# Patient Record
Sex: Female | Born: 1965 | Race: Black or African American | Hispanic: No | Marital: Single | Smoking: Never smoker
Health system: Southern US, Community
[De-identification: ages and names within clinical notes are randomized; demographics above are authoritative.]

## PROBLEM LIST (undated history)

## (undated) DIAGNOSIS — G43909 Migraine, unspecified, not intractable, without status migrainosus: Secondary | ICD-10-CM

## (undated) DIAGNOSIS — M5136 Other intervertebral disc degeneration, lumbar region: Secondary | ICD-10-CM

## (undated) DIAGNOSIS — M51369 Other intervertebral disc degeneration, lumbar region without mention of lumbar back pain or lower extremity pain: Secondary | ICD-10-CM

## (undated) HISTORY — DX: Other intervertebral disc degeneration, lumbar region: M51.36

## (undated) HISTORY — DX: Migraine, unspecified, not intractable, without status migrainosus: G43.909

## (undated) HISTORY — DX: Other intervertebral disc degeneration, lumbar region without mention of lumbar back pain or lower extremity pain: M51.369

## (undated) HISTORY — PX: COLONOSCOPY: SHX174

## (undated) HISTORY — PX: BUNIONECTOMY: SHX129

---

## 2008-12-14 DIAGNOSIS — Z90710 Acquired absence of both cervix and uterus: Secondary | ICD-10-CM

## 2008-12-14 HISTORY — PX: BACK SURGERY: SHX140

## 2008-12-14 HISTORY — DX: Acquired absence of both cervix and uterus: Z90.710

## 2022-05-18 ENCOUNTER — Other Ambulatory Visit: Payer: Self-pay | Admitting: Family Medicine

## 2022-05-18 DIAGNOSIS — Z1231 Encounter for screening mammogram for malignant neoplasm of breast: Secondary | ICD-10-CM

## 2022-05-19 ENCOUNTER — Ambulatory Visit
Admission: RE | Admit: 2022-05-19 | Discharge: 2022-05-19 | Disposition: A | Discharge status: Home / Self Care | Payer: BLUE CROSS/BLUE SHIELD | Source: Ambulatory Visit | Attending: Family Medicine | Admitting: Family Medicine

## 2022-05-19 DIAGNOSIS — Z1231 Encounter for screening mammogram for malignant neoplasm of breast: Secondary | ICD-10-CM

## 2022-08-21 ENCOUNTER — Encounter: Payer: Self-pay | Admitting: Adult Health

## 2022-08-21 ENCOUNTER — Ambulatory Visit (INDEPENDENT_AMBULATORY_CARE_PROVIDER_SITE_OTHER): Payer: BLUE CROSS/BLUE SHIELD | Admitting: Adult Health

## 2022-08-21 VITALS — BP 128/80 | HR 82 | Temp 97.9°F | Ht 69.0 in | Wt 238.0 lb

## 2022-08-21 DIAGNOSIS — Z6835 Body mass index (BMI) 35.0-35.9, adult: Secondary | ICD-10-CM

## 2022-08-21 DIAGNOSIS — M5441 Lumbago with sciatica, right side: Secondary | ICD-10-CM

## 2022-08-21 DIAGNOSIS — M5442 Lumbago with sciatica, left side: Secondary | ICD-10-CM

## 2022-08-21 DIAGNOSIS — E1169 Type 2 diabetes mellitus with other specified complication: Secondary | ICD-10-CM | POA: Diagnosis not present

## 2022-08-21 DIAGNOSIS — G43109 Migraine with aura, not intractable, without status migrainosus: Secondary | ICD-10-CM

## 2022-08-21 DIAGNOSIS — E119 Type 2 diabetes mellitus without complications: Secondary | ICD-10-CM | POA: Insufficient documentation

## 2022-08-21 DIAGNOSIS — M545 Low back pain, unspecified: Secondary | ICD-10-CM | POA: Insufficient documentation

## 2022-08-21 DIAGNOSIS — G8929 Other chronic pain: Secondary | ICD-10-CM

## 2022-08-21 DIAGNOSIS — E669 Obesity, unspecified: Secondary | ICD-10-CM | POA: Diagnosis not present

## 2022-08-21 MED ORDER — GABAPENTIN 300 MG PO CAPS: 300.0000 mg | ORAL_CAPSULE | Freq: Three times a day (TID) | ORAL | 1 refills | Status: DC

## 2022-08-21 MED ORDER — UBRELVY 100 MG PO TABS: 1.0000 | ORAL_TABLET | ORAL | 0 refills | Status: DC | PRN

## 2022-08-21 NOTE — Progress Notes (Signed)
Location:  [facility]  Provider: Durenda Age DNP  Code Status:   Full Code  Goals of Care:     08/21/2022    8:26 AM  Advanced Directives  Does Patient Have a Medical Advance Directive? No  Does patient want to make changes to medical advance directive? Yes (MAU/Ambulatory/Procedural Areas - Information given)     Chief Complaint  Patient presents with   Establish Care    New patient to establish care. Patient does not have pill bottles present at initial appointment.     HPI: Patient is a 56 y.o. female seen today to establish care with [facility]. She has a PMH of hypertension. She recently moved to [place], [place] from [place]. She lives by herself. She takes Gabapentin for her chronic low back pain. She takes [place] for migraine. She has 2-10 migraine episodes in a month. She has diabetes mellitus and is not on  any medication. Latest hgbA1c 6.5.   Her mother suffered from hypertension and died at 26 years of age. Father's history is unknown since her father was killed when she was young. She has a sister who died of rectal cancer at the age of 52. She had colonoscopy in 2018. She had total hysterectomy in 2012 due to an ovarian rupture. She has a brother who is blind and another brother who died of hypertension and diabetes mellitus. She has a Conservator, museum/gallery who is healthy.   Past Medical History:  Diagnosis Date   Bulging lumbar disc    H/O: hysterectomy 2010   Complete   Migraines     Past Surgical History:  Procedure Laterality Date   BACK SURGERY  2010   BUNIONECTOMY      Allergies  Allergen Reactions   Grass Pollen(K-O-R-T-Swt Vern) Other (See Comments)   Codeine Nausea And Vomiting    Outpatient Encounter Medications as of 08/21/2022  Medication Sig   COMBIGAN 0.2-0.5 % ophthalmic solution Apply 1 drop to eye 2 (two) times daily.   gabapentin (NEURONTIN) 300 MG capsule Take 300 mg by mouth 3 (three) times daily.   Multiple Vitamin  (MULTIVITAMIN) tablet Take 1 tablet by mouth daily.   Ubrogepant (UBRELVY) 100 MG TABS Take 1 tablet by mouth as needed (For Migrainges).   No facility-administered encounter medications on file as of 08/21/2022.    Review of Systems:  Review of Systems  Constitutional:  Negative for appetite change, chills, fatigue and fever.  HENT:  Negative for congestion, hearing loss, rhinorrhea and sore throat.   Eyes: Negative.   Respiratory:  Negative for cough, shortness of breath and wheezing.   Cardiovascular:  Negative for chest pain, palpitations and leg swelling.  Gastrointestinal:  Negative for abdominal pain, constipation, diarrhea, nausea and vomiting.  Genitourinary:  Negative for dysuria.  Musculoskeletal:  Negative for arthralgias, back pain and myalgias.  Skin:  Negative for color change, rash and wound.  Neurological:  Negative for dizziness, weakness and headaches.  Psychiatric/Behavioral:  Negative for behavioral problems. The patient is not nervous/anxious.     Health Maintenance  Topic Date Due   HIV Screening  Never done   Hepatitis C Screening  Never done   TETANUS/TDAP  Never done   PAP SMEAR-Modifier  Never done   COLONOSCOPY (Pts 45-74yr Insurance coverage will need to be confirmed)  Never done   Zoster Vaccines- Shingrix (1 of 2) Never done   COVID-19 Vaccine (4 - Pfizer series) 03/27/2021   INFLUENZA VACCINE  Never done  MAMMOGRAM  05/19/2024   HPV VACCINES  Aged Out    Physical Exam: Vitals:   08/21/22 0818  BP: 128/80  Pulse: 82  Temp: 97.9 F (36.6 C)  TempSrc: Temporal  SpO2: 98%  Weight: 238 lb (108 kg)  Height: '5\' 9"'$  (1.753 m)   Body mass index is 35.15 kg/m. Physical Exam Constitutional:      Appearance: She is obese.  HENT:     Head: Normocephalic and atraumatic.     Nose: Nose normal.     Mouth/Throat:     Mouth: Mucous membranes are moist.  Eyes:     Conjunctiva/sclera: Conjunctivae normal.  Cardiovascular:     Rate and Rhythm:  Normal rate and regular rhythm.  Pulmonary:     Effort: Pulmonary effort is normal.     Breath sounds: Normal breath sounds.  Abdominal:     General: Bowel sounds are normal.     Palpations: Abdomen is soft.  Musculoskeletal:        General: Normal range of motion.     Cervical back: Normal range of motion.  Skin:    General: Skin is warm and dry.  Neurological:     General: No focal deficit present.     Mental Status: She is alert and oriented to person, place, and time.  Psychiatric:        Mood and Affect: Mood normal.        Behavior: Behavior normal.        Thought Content: Thought content normal.        Judgment: Judgment normal.     Labs reviewed: Basic Metabolic Panel: No results for input(s): "NA", "K", "CL", "CO2", "GLUCOSE", "BUN", "CREATININE", "CALCIUM", "MG", "PHOS", "TSH" in the last 8760 hours. Liver Function Tests: No results for input(s): "AST", "ALT", "ALKPHOS", "BILITOT", "PROT", "ALBUMIN" in the last 8760 hours. No results for input(s): "LIPASE", "AMYLASE" in the last 8760 hours. No results for input(s): "AMMONIA" in the last 8760 hours. CBC: No results for input(s): "WBC", "NEUTROABS", "HGB", "HCT", "MCV", "PLT" in the last 8760 hours. Lipid Panel: No results for input(s): "CHOL", "HDL", "LDLCALC", "TRIG", "CHOLHDL", "LDLDIRECT" in the last 8760 hours. No results found for: "HGBA1C"  Procedures since last visit: No results found.  Assessment/Plan . 1. Chronic bilateral low back pain with bilateral sciatica -  stable - gabapentin (NEURONTIN) 300 MG capsule; Take 1 capsule (300 mg total) by mouth 3 (three) times daily.  Dispense: 180 capsule; Refill: 1  2. Migraine with aura and without status migrainosus, not intractable - 2-10 episodes in a month - Ubrogepant (UBRELVY) 100 MG TABS; Take 1 tablet by mouth as needed (For Migrainges).  Dispense: 10 tablet; Refill: 0  3. Type 2 diabetes mellitus with other specified complication, without long-term  current use of insulin ([place]) -  last hgbA1c 6.5, 03/28/21 -  diet-controlled  4. Class 2 obesity with body mass index (BMI) of 35.0 to 35.9 in adult, unspecified obesity type, unspecified whether serious comorbidity present Body mass index is 35.15 kg/m. -  discussed weight loss and dietary choices -  increase vegetable intake    Labs/tests ordered:   Will need annual labs done next visit.  Next appt:  in 3 months

## 2022-08-21 NOTE — Patient Instructions (Signed)
Preventive Care 40-58 Years Old, Female Preventive care refers to lifestyle choices and visits with your health care provider that can promote health and wellness. Preventive care visits are also called wellness exams. What can I expect for my preventive care visit? Counseling Your health care provider may ask you questions about your: Medical history, including: Past medical problems. Family medical history. Pregnancy history. Current health, including: Menstrual cycle. Method of birth control. Emotional well-being. Home life and relationship well-being. Sexual activity and sexual health. Lifestyle, including: Alcohol, nicotine or tobacco, and drug use. Access to firearms. Diet, exercise, and sleep habits. Work and work environment. Sunscreen use. Safety issues such as seatbelt and bike helmet use. Physical exam Your health care provider will check your: Height and weight. These may be used to calculate your BMI (body mass index). BMI is a measurement that tells if you are at a healthy weight. Waist circumference. This measures the distance around your waistline. This measurement also tells if you are at a healthy weight and may help predict your risk of certain diseases, such as type 2 diabetes and high blood pressure. Heart rate and blood pressure. Body temperature. Skin for abnormal spots. What immunizations do I need?  Vaccines are usually given at various ages, according to a schedule. Your health care provider will recommend vaccines for you based on your age, medical history, and lifestyle or other factors, such as travel or where you work. What tests do I need? Screening Your health care provider may recommend screening tests for certain conditions. This may include: Lipid and cholesterol levels. Diabetes screening. This is done by checking your blood sugar (glucose) after you have not eaten for a while (fasting). Pelvic exam and Pap test. Hepatitis B test. Hepatitis C  test. HIV (human immunodeficiency virus) test. STI (sexually transmitted infection) testing, if you are at risk. Lung cancer screening. Colorectal cancer screening. Mammogram. Talk with your health care provider about when you should start having regular mammograms. This may depend on whether you have a family history of breast cancer. BRCA-related cancer screening. This may be done if you have a family history of breast, ovarian, tubal, or peritoneal cancers. Bone density scan. This is done to screen for osteoporosis. Talk with your health care provider about your test results, treatment options, and if necessary, the need for more tests. Follow these instructions at home: Eating and drinking  Eat a diet that includes fresh fruits and vegetables, whole grains, lean protein, and low-fat dairy products. Take vitamin and mineral supplements as recommended by your health care provider. Do not drink alcohol if: Your health care provider tells you not to drink. You are pregnant, may be pregnant, or are planning to become pregnant. If you drink alcohol: Limit how much you have to 0-1 drink a day. Know how much alcohol is in your drink. In the [place] one drink equals one 12 oz bottle of beer (355 mL), one 5 oz glass of wine (148 mL), or one 1 oz glass of hard liquor (44 mL). Lifestyle Brush your teeth every morning and night with fluoride toothpaste. Floss one time each day. Exercise for at least 30 minutes 5 or more days each week. Do not use any products that contain nicotine or tobacco. These products include cigarettes, chewing tobacco, and vaping devices, such as e-cigarettes. If you need help quitting, ask your health care provider. Do not use drugs. If you are sexually active, practice safe sex. Use a condom or other form of protection to   prevent STIs. If you do not wish to become pregnant, use a form of birth control. If you plan to become pregnant, see your health care provider for a  prepregnancy visit. Take aspirin only as told by your health care provider. Make sure that you understand how much to take and what form to take. Work with your health care provider to find out whether it is safe and beneficial for you to take aspirin daily. Find healthy ways to manage stress, such as: Meditation, yoga, or listening to music. Journaling. Talking to a trusted person. Spending time with friends and family. Minimize exposure to UV radiation to reduce your risk of skin cancer. Safety Always wear your seat belt while driving or riding in a vehicle. Do not drive: If you have been drinking alcohol. Do not ride with someone who has been drinking. When you are tired or distracted. While texting. If you have been using any mind-altering substances or drugs. Wear a helmet and other protective equipment during sports activities. If you have firearms in your house, make sure you follow all gun safety procedures. Seek help if you have been physically or sexually abused. What's next? Visit your health care provider once a year for an annual wellness visit. Ask your health care provider how often you should have your eyes and teeth checked. Stay up to date on all vaccines. This information is not intended to replace advice given to you by your health care provider. Make sure you discuss any questions you have with your health care provider. Document Revised: 05/28/2021 Document Reviewed: 05/28/2021 Elsevier Patient Education  [place].

## 2022-08-27 ENCOUNTER — Telehealth: Payer: Self-pay | Admitting: *Deleted

## 2022-08-27 NOTE — Telephone Encounter (Signed)
[employer] faxed from for a Prior Authorization for Ubrelvy '100mg'$   Placed in Destiny Fuentes's folder to review and sign.   To be faxed back to fax:[phone] once completed.

## 2022-08-28 ENCOUNTER — Encounter: Payer: Self-pay | Admitting: Adult Health

## 2022-08-28 NOTE — Telephone Encounter (Signed)
Authorization signed and faxed.

## 2022-09-02 ENCOUNTER — Other Ambulatory Visit: Payer: Self-pay | Admitting: Adult Health

## 2022-09-02 DIAGNOSIS — Z800 Family history of malignant neoplasm of digestive organs: Secondary | ICD-10-CM

## 2022-09-02 NOTE — Telephone Encounter (Signed)
Good Morning Destiny Fuentes,  Patient is calling because a message was sent to you on 08/28/22 and she has yet to hear back.   Patient states you told her that you would review her records from previous provider and call her with next the step. Patient did not elaborate further as to what you would be determining from reviewing her previous records.  Please advise. Patient is expecting a return call today.

## 2022-09-21 ENCOUNTER — Other Ambulatory Visit: Payer: Self-pay | Admitting: Adult Health

## 2022-09-21 DIAGNOSIS — G43109 Migraine with aura, not intractable, without status migrainosus: Secondary | ICD-10-CM

## 2022-09-21 MED ORDER — UBRELVY 100 MG PO TABS: ORAL_TABLET | ORAL | 0 refills | Status: DC

## 2022-09-29 ENCOUNTER — Encounter: Payer: Self-pay | Admitting: Adult Health

## 2022-10-15 ENCOUNTER — Telehealth: Payer: Self-pay

## 2022-10-15 NOTE — Telephone Encounter (Signed)
Patient is calling the office to check on the statud of her UBRELVY 100 mg tablets. Patient states she has been waiting on a approval or denial for a couple of weeks. Patient said she has been waiting weeks to hear from our office, and feels like she has been very patient.

## 2022-10-16 ENCOUNTER — Other Ambulatory Visit: Payer: Self-pay | Admitting: Adult Health

## 2022-10-16 DIAGNOSIS — G43109 Migraine with aura, not intractable, without status migrainosus: Secondary | ICD-10-CM

## 2022-10-27 NOTE — Telephone Encounter (Signed)
Received Fax from [place] stating that Destiny Fuentes '100mg'$  is DENIED due to Coverage is provided for patient's who tried at least one triptan or have a contraindication to triptans.   Fax from [place] routed to [place] for advice.

## 2022-10-29 NOTE — Telephone Encounter (Signed)
Medina-Vargas, [name] C, NP  You1 hour ago (9:04 AM)   Noted. Patient was aware of it and will discuss on next visit.

## 2022-11-20 ENCOUNTER — Ambulatory Visit: Payer: BLUE CROSS/BLUE SHIELD | Admitting: Adult Health

## 2022-11-26 ENCOUNTER — Encounter: Payer: Self-pay | Admitting: Adult Health

## 2022-11-27 ENCOUNTER — Ambulatory Visit: Payer: Managed Care, Other (non HMO) | Admitting: Adult Health

## 2022-11-27 ENCOUNTER — Encounter: Payer: Self-pay | Admitting: Adult Health

## 2022-11-27 VITALS — BP 118/80 | HR 63 | Temp 97.3°F | Resp 18 | Ht 69.0 in | Wt 234.0 lb

## 2022-11-27 DIAGNOSIS — G43109 Migraine with aura, not intractable, without status migrainosus: Secondary | ICD-10-CM

## 2022-11-27 DIAGNOSIS — E669 Obesity, unspecified: Secondary | ICD-10-CM | POA: Diagnosis not present

## 2022-11-27 DIAGNOSIS — Z6835 Body mass index (BMI) 35.0-35.9, adult: Secondary | ICD-10-CM

## 2022-11-27 DIAGNOSIS — G8929 Other chronic pain: Secondary | ICD-10-CM

## 2022-11-27 DIAGNOSIS — E1169 Type 2 diabetes mellitus with other specified complication: Secondary | ICD-10-CM

## 2022-11-27 DIAGNOSIS — M5442 Lumbago with sciatica, left side: Secondary | ICD-10-CM

## 2022-11-27 DIAGNOSIS — E7800 Pure hypercholesterolemia, unspecified: Secondary | ICD-10-CM

## 2022-11-27 DIAGNOSIS — M5441 Lumbago with sciatica, right side: Secondary | ICD-10-CM

## 2022-11-27 NOTE — Progress Notes (Signed)
[facility]  Provider:  Durenda Age DNP  Code Status:  Full Code  Goals of Care:     11/26/2022   12:48 PM  Advanced Directives  Does Patient Have a Medical Advance Directive? No  Would patient like information on creating a medical advance directive? No - Patient declined     Chief Complaint  Patient presents with   Medical Management of Chronic Issues    Patient is here for a follow up for chronic conditions     HPI: Patient is a 56 y.o. female seen today for medical management of chronic issues.    Migraine -  she has migraine 2-3X/month, had episode last night. Prior authorization for Roselyn Meier was denied by insurance. She wants to try Rizatriptan which she used before.  Type 2 diabetes mellitus -  not on medications, eats less carbs  Hypercholesterolemia -  eat more vegetables and fruits, started going to the gym a month ago and exercise  for 3 hours X 6 days a week    Past Medical History:  Diagnosis Date   Bulging lumbar disc    H/O: hysterectomy 2010   Complete   Migraines     Past Surgical History:  Procedure Laterality Date   BACK SURGERY  2010   BUNIONECTOMY      Allergies  Allergen Reactions   Grass Pollen(K-O-R-T-Swt Vern) Other (See Comments)   Pollen Extract Other (See Comments)   Codeine Nausea And Vomiting    Outpatient Encounter Medications as of 11/27/2022  Medication Sig   COMBIGAN 0.2-0.5 % ophthalmic solution Apply 1 drop to eye 2 (two) times daily.   gabapentin (NEURONTIN) 300 MG capsule Take 1 capsule (300 mg total) by mouth 3 (three) times daily.   Multiple Vitamin (MULTIVITAMIN) tablet Take 1 tablet by mouth daily.   Ubrogepant (UBRELVY) 100 MG TABS TAKE 1 TABLET BY MOUTH AS NEEDED FOR MIGRAINES G43.109   No facility-administered encounter medications on file as of 11/27/2022.    Review of Systems:  Review of Systems  Constitutional:  Negative for appetite change, chills, fatigue and fever.  HENT:  Negative for  congestion, hearing loss, rhinorrhea and sore throat.   Eyes: Negative.   Respiratory:  Negative for cough, shortness of breath and wheezing.   Cardiovascular:  Negative for chest pain, palpitations and leg swelling.  Gastrointestinal:  Negative for abdominal pain, constipation, diarrhea, nausea and vomiting.  Genitourinary:  Negative for dysuria.  Musculoskeletal:  Negative for arthralgias, back pain and myalgias.  Skin:  Negative for color change, rash and wound.  Neurological:  Negative for dizziness, weakness and headaches.       Headache 2-3X/month  Psychiatric/Behavioral:  Negative for behavioral problems. The patient is not nervous/anxious.     Health Maintenance  Topic Date Due   HEMOGLOBIN A1C  Never done   FOOT EXAM  Never done   OPHTHALMOLOGY EXAM  Never done   HIV Screening  Never done   Diabetic kidney evaluation - eGFR measurement  Never done   Diabetic kidney evaluation - Urine ACR  Never done   Hepatitis C Screening  Never done   DTaP/Tdap/Td (1 - Tdap) Never done   PAP SMEAR-Modifier  Never done   COLONOSCOPY (Pts 45-46yr Insurance coverage will need to be confirmed)  Never done   Zoster Vaccines- Shingrix (1 of 2) Never done   INFLUENZA VACCINE  Never done   COVID-19 Vaccine (4 - 2023-24 season) 08/14/2022   MAMMOGRAM  05/19/2024   HPV  VACCINES  Aged Out    Physical Exam: Vitals:   11/27/22 0837  BP: 118/80  Pulse: 63  Resp: 18  Temp: (!) 97.3 F (36.3 C)  SpO2: 99%  Weight: 234 lb (106.1 kg)  Height: _0  (1.753 m)   Body mass index is 34.56 kg/m. Physical Exam Constitutional:      General: She is not in acute distress.    Appearance: She is obese.  HENT:     Head: Normocephalic and atraumatic.     Nose: Nose normal.     Mouth/Throat:     Mouth: Mucous membranes are moist.  Eyes:     Conjunctiva/sclera: Conjunctivae normal.  Cardiovascular:     Rate and Rhythm: Normal rate and regular rhythm.  Pulmonary:     Effort: Pulmonary effort is  normal.     Breath sounds: Normal breath sounds.  Abdominal:     General: Bowel sounds are normal.     Palpations: Abdomen is soft.  Musculoskeletal:        General: Normal range of motion.     Cervical back: Normal range of motion.  Skin:    General: Skin is warm and dry.  Neurological:     General: No focal deficit present.     Mental Status: She is alert and oriented to person, place, and time.  Psychiatric:        Mood and Affect: Mood normal.        Behavior: Behavior normal.        Thought Content: Thought content normal.        Judgment: Judgment normal.     Labs reviewed: Basic Metabolic Panel: No results for input(s): "NA", "K", "CL", "CO2", "GLUCOSE", "BUN", "CREATININE", "CALCIUM", "MG", "PHOS", "TSH" in the last 8760 hours. Liver Function Tests: No results for input(s): "AST", "ALT", "ALKPHOS", "BILITOT", "PROT", "ALBUMIN" in the last 8760 hours. No results for input(s): "LIPASE", "AMYLASE" in the last 8760 hours. No results for input(s): "AMMONIA" in the last 8760 hours. CBC: No results for input(s): "WBC", "NEUTROABS", "HGB", "HCT", "MCV", "PLT" in the last 8760 hours. Lipid Panel: No results for input(s): "CHOL", "HDL", "LDLCALC", "TRIG", "CHOLHDL", "LDLDIRECT" in the last 8760 hours. No results found for: "HGBA1C"  Procedures since last visit: No results found.  Assessment/Plan  1. Migraine with aura and without status migrainosus, not intractable -  prior authorization was for Roselyn Meier was denied by insurance -  would like to try Rizatriptan  2. Type 2 diabetes mellitus with other specified complication, without long-term current use of insulin ([facility]) -  diet-controlled - CBC With Differential/Platelet; Future - CMP with eGFR([facility]); Future - Hemoglobin A1C; Future - TSH; Future   3. Chronic bilateral low back pain with bilateral sciatica -  stable, takes Gabapentin  4. Class 2 obesity with body mass index (BMI) of 35.0 to 35.9 in adult,  unspecified obesity type, unspecified whether serious comorbidity present Wt Readings from Last 3 Encounters:  11/27/22 234 lb (106.1 kg)  08/21/22 238 lb (108 kg)  Recently joined a gym membership and exercises for 3 hours X 6 days a week   5. Hypercholesterolemia - cholesterol 238, LDL 148 -  03/28/21 - Lipid panel; Future     Labs/tests ordered:   - CBC With Differential/Platelet; Future - CMP with eGFR([facility]); Future - Hemoglobin A1C; Future - TSH; Future - Lipid panel; Future   Next appt:  in a month

## 2022-11-27 NOTE — Patient Instructions (Signed)
Migraine Headache  A migraine headache is a very strong throbbing pain on one side or both sides of your head. This type of headache can also cause other symptoms. It can last from 4 hours to 3 days. Talk with your doctor about what things may bring on (trigger) this condition.  What are the causes?  The exact cause of this condition is not known. This condition may be triggered or caused by:  Drinking alcohol.  Smoking.  Taking medicines, such as:  Medicine used to treat chest pain (nitroglycerin).  Birth control pills.  Estrogen.  Some blood pressure medicines.  Eating or drinking certain products.  Doing physical activity.  Other things that may trigger a migraine headache include:  Having a menstrual period.  Pregnancy.  Hunger.  Stress.  Not getting enough sleep or getting too much sleep.  Weather changes.  Tiredness (fatigue).  What increases the risk?  Being 25-55 years old.  Being female.  Having a family history of migraine headaches.  Being Caucasian.  Having depression or anxiety.  Being very overweight.  What are the signs or symptoms?  A throbbing pain. This pain may:  Happen in any area of the head, such as on one side or both sides.  Make it hard to do daily activities.  Get worse with physical activity.  Get worse around bright lights or loud noises.  Other symptoms may include:  Feeling sick to your stomach (nauseous).  Vomiting.  Dizziness.  Being sensitive to bright lights, loud noises, or smells.  Before you get a migraine headache, you may get warning signs (an aura). An aura may include:  Seeing flashing lights or having blind spots.  Seeing bright spots, halos, or zigzag lines.  Having tunnel vision or blurred vision.  Having numbness or a tingling feeling.  Having trouble talking.  Having weak muscles.  Some people have symptoms after a migraine headache (postdromal phase), such as:  Tiredness.  Trouble thinking (concentrating).  How is this treated?  Taking medicines that:  Relieve  pain.  Relieve the feeling of being sick to your stomach.  Prevent migraine headaches.  Treatment may also include:  Having acupuncture.  Avoiding foods that bring on migraine headaches.  Learning ways to control your body functions (biofeedback).  Therapy to help you know and deal with negative thoughts (cognitive behavioral therapy).  Follow these instructions at home:  Medicines  Take over-the-counter and prescription medicines only as told by your doctor.  Ask your doctor if the medicine prescribed to you:  Requires you to avoid driving or using heavy machinery.  Can cause trouble pooping (constipation). You may need to take these steps to prevent or treat trouble pooping:  Drink enough fluid to keep your pee (urine) pale yellow.  Take over-the-counter or prescription medicines.  Eat foods that are high in fiber. These include beans, whole grains, and fresh fruits and vegetables.  Limit foods that are high in fat and sugar. These include fried or sweet foods.  Lifestyle  Do not drink alcohol.  Do not use any products that contain nicotine or tobacco, such as cigarettes, e-cigarettes, and chewing tobacco. If you need help quitting, ask your doctor.  Get at least 8 hours of sleep every night.  Limit and deal with stress.  General instructions  Keep a journal to find out what may bring on your migraine headaches. For example, write down:  What you eat and drink.  How much sleep you get.  Any change in   what you eat or drink.  Any change in your medicines.  If you have a migraine headache:  Avoid things that make your symptoms worse, such as bright lights.  It may help to lie down in a dark, quiet room.  Do not drive or use heavy machinery.  Ask your doctor what activities are safe for you.  Keep all follow-up visits as told by your doctor. This is important.  Contact a doctor if:  You get a migraine headache that is different or worse than others you have had.  You have more than 15 headache days in one month.  Get  help right away if:  Your migraine headache gets very bad.  Your migraine headache lasts longer than 72 hours.  You have a fever.  You have a stiff neck.  You have trouble seeing.  Your muscles feel weak or like you cannot control them.  You start to lose your balance a lot.  You start to have trouble walking.  You pass out (faint).  You have a seizure.  Summary  A migraine headache is a very strong throbbing pain on one side or both sides of your head. These headaches can also cause other symptoms.  This condition may be treated with medicines and changes to your lifestyle.  Keep a journal to find out what may bring on your migraine headaches.  Contact a doctor if you get a migraine headache that is different or worse than others you have had.  Contact your doctor if you have more than 15 headache days in a month.  This information is not intended to replace advice given to you by your health care provider. Make sure you discuss any questions you have with your health care provider.  Document Revised: 05/14/2022 Document Reviewed: 01/12/2019  Elsevier Patient Education  2023 [employer]

## 2022-11-28 LAB — COMPLETE METABOLIC PANEL WITH GFR
AG Ratio: 1.6 (calc) (ref 1.0–2.5)
ALT: 15 U/L (ref 6–29)
AST: 18 U/L (ref 10–35)
Albumin: 4.3 g/dL (ref 3.6–5.1)
Alkaline phosphatase (APISO): 105 U/L (ref 37–153)
BUN: 17 mg/dL (ref 7–25)
CO2: 26 mmol/L (ref 20–32)
Calcium: 9.6 mg/dL (ref 8.6–10.4)
Chloride: 107 mmol/L (ref 98–110)
Creat: 0.85 mg/dL (ref 0.50–1.03)
Globulin: 2.7 g/dL (calc) (ref 1.9–3.7)
Glucose, Bld: 136 mg/dL — ABNORMAL HIGH (ref 65–99)
Potassium: 4.4 mmol/L (ref 3.5–5.3)
Sodium: 140 mmol/L (ref 135–146)
Total Bilirubin: 0.7 mg/dL (ref 0.2–1.2)
Total Protein: 7 g/dL (ref 6.1–8.1)
eGFR: 79 mL/min/{1.73_m2} (ref >=60)

## 2022-11-28 LAB — CBC WITH DIFFERENTIAL/PLATELET
Absolute Monocytes: 307 {cells}/uL (ref 200–950)
Basophils Absolute: 48 {cells}/uL (ref 0–200)
Basophils Relative: 0.9 %
Eosinophils Absolute: 127 {cells}/uL (ref 15–500)
Eosinophils Relative: 2.4 %
HCT: 42.6 % (ref 35.0–45.0)
Hemoglobin: 14.1 g/dL (ref 11.7–15.5)
Lymphs Abs: 1330 {cells}/uL (ref 850–3900)
MCH: 30.4 pg (ref 27.0–33.0)
MCHC: 33.1 g/dL (ref 32.0–36.0)
MCV: 91.8 fL (ref 80.0–100.0)
MPV: 11 fL (ref 7.5–12.5)
Monocytes Relative: 5.8 %
Neutro Abs: 3487 {cells}/uL (ref 1500–7800)
Neutrophils Relative %: 65.8 %
Platelets: 275 Thousand/uL (ref 140–400)
RBC: 4.64 Million/uL (ref 3.80–5.10)
RDW: 12.6 % (ref 11.0–15.0)
Total Lymphocyte: 25.1 %
WBC: 5.3 Thousand/uL (ref 3.8–10.8)

## 2022-11-28 LAB — LIPID PANEL
Cholesterol: 215 mg/dL — ABNORMAL HIGH
HDL: 56 mg/dL
LDL Cholesterol (Calc): 137 mg/dL — ABNORMAL HIGH
Non-HDL Cholesterol (Calc): 159 mg/dL — ABNORMAL HIGH
Total CHOL/HDL Ratio: 3.8 (calc)
Triglycerides: 108 mg/dL

## 2022-11-28 LAB — HEMOGLOBIN A1C
Hgb A1c MFr Bld: 6.6 %{Hb} — ABNORMAL HIGH
Mean Plasma Glucose: 143 mg/dL
eAG (mmol/L): 7.9 mmol/L

## 2022-11-28 LAB — TSH: TSH: 1 m[IU]/L (ref 0.40–4.50)

## 2022-11-29 MED ORDER — RIZATRIPTAN BENZOATE 5 MG PO TABS: 5.0000 mg | ORAL_TABLET | ORAL | 3 refills | Status: DC | PRN

## 2022-11-29 NOTE — Addendum Note (Signed)
Addended by: Durenda Age C on: 11/29/2022 11:17 AM   Modules accepted: Orders

## 2022-12-02 ENCOUNTER — Telehealth: Payer: Self-pay | Admitting: Gastroenterology

## 2022-12-02 NOTE — Telephone Encounter (Signed)
Good afternoon Dr. Loletha Carrow,  Supervising MD for 12/19 PM    Patient called stating that she has a referral in from [place] to be seen for Family history of malignant neoplasm of digestive organ. Patients stated that she had her last procedure in 2018 in [place] with Dr. Darrol Jump. Patients colonoscopy and pathology report are in epic, will you please review and advise on scheduling patient?   Thank you.

## 2022-12-03 NOTE — Telephone Encounter (Signed)
LVM for patient to call back to schedule colon and PV.

## 2022-12-03 NOTE — Telephone Encounter (Signed)
(  For documentation purposes-colonoscopy August 2018 in [place] for history of colon cancer in patient's sister.  Polypoid tissue removed, no adenomatous or serrated change.)  This patient is due for a screening colonoscopy for family history of colon cancer.  It can be directly booked with me in the [facility].  Please make the arrangements.  - HD

## 2022-12-04 ENCOUNTER — Encounter: Payer: Self-pay | Admitting: Gastroenterology

## 2022-12-16 NOTE — Progress Notes (Signed)
-     Cholesterol 215, elevated but lower than last year which was 238 -   LDL 137, elevated but lower lower than last year which was 148 -  A1c 6.6, elevated, higher than last year which was 6.5 -  do regular exercise of at least 150 minutes weekly and low carb diet

## 2022-12-31 ENCOUNTER — Ambulatory Visit: Payer: BLUE CROSS/BLUE SHIELD | Admitting: Adult Health

## 2023-01-07 ENCOUNTER — Ambulatory Visit (AMBULATORY_SURGERY_CENTER): Payer: BLUE CROSS/BLUE SHIELD | Admitting: *Deleted

## 2023-01-07 VITALS — Ht 69.5 in | Wt 230.0 lb

## 2023-01-07 DIAGNOSIS — Z1211 Encounter for screening for malignant neoplasm of colon: Secondary | ICD-10-CM

## 2023-01-07 DIAGNOSIS — Z800 Family history of malignant neoplasm of digestive organs: Secondary | ICD-10-CM

## 2023-01-07 MED ORDER — NA SULFATE-K SULFATE-MG SULF 17.5-3.13-1.6 GM/177ML PO SOLN: 1.0000 | Freq: Once | ORAL | 0 refills | Status: AC

## 2023-01-07 NOTE — Progress Notes (Signed)
No egg or soy allergy known to patient  No issues known to pt with past sedation with any surgeries or procedures Patient denies ever being told they had issues or difficulty with intubation  No FH of Malignant Hyperthermia Pt is not on diet pills Pt is not on  home 02  Pt is not on blood thinners  Pt denies issues with constipation  Pt is not on dialysis Pt denies any upcoming cardiac testing Pt encouraged to use to use Singlecare or Goodrx to reduce cost  Patient's chart reviewed by Destiny Fuentes CNRA prior to previsit and patient appropriate for the [facility].  Previsit completed and red dot placed by patient's name on their procedure day (on provider's schedule).  .   

## 2023-01-14 ENCOUNTER — Encounter: Payer: Self-pay | Admitting: Gastroenterology

## 2023-01-25 ENCOUNTER — Ambulatory Visit: Payer: Managed Care, Other (non HMO) | Admitting: Gastroenterology

## 2023-01-25 ENCOUNTER — Encounter: Payer: Self-pay | Admitting: Gastroenterology

## 2023-01-25 VITALS — BP 97/60 | HR 51 | Temp 97.5°F | Resp 13 | Ht 69.0 in | Wt 230.0 lb

## 2023-01-25 DIAGNOSIS — Z1211 Encounter for screening for malignant neoplasm of colon: Secondary | ICD-10-CM

## 2023-01-25 DIAGNOSIS — Z800 Family history of malignant neoplasm of digestive organs: Secondary | ICD-10-CM | POA: Diagnosis not present

## 2023-01-25 DIAGNOSIS — D122 Benign neoplasm of ascending colon: Secondary | ICD-10-CM

## 2023-01-25 MED ORDER — SODIUM CHLORIDE 0.9 % IV SOLN: 500.0000 mL | Freq: Once | INTRAVENOUS | Status: DC

## 2023-01-25 NOTE — Progress Notes (Signed)
Called to room to assist during endoscopic procedure.  Patient ID and intended procedure confirmed with present staff. Received instructions for my participation in the procedure from the performing physician.  

## 2023-01-25 NOTE — Op Note (Signed)
[facility] Patient Name: Destiny Fuentes Procedure Date: 01/25/2023 9:44 AM MRN: [id number] Endoscopist: Mallie Mussel L. Loletha Carrow , MD, [id number] Age: 57 Referring MD:  Date of Birth: 08/11/1966 Gender: Female Account #: [id number] Procedure:                Colonoscopy Indications:              Colon cancer screening in patient at increased                            risk: Colorectal cancer in sister                           No polyps last colonoscopy in [place] August 2018 Medicines:                Monitored Anesthesia Care Procedure:                Pre-Anesthesia Assessment:                           - Prior to the procedure, a History and Physical                            was performed, and patient medications and                            allergies were reviewed. The patient's tolerance of                            previous anesthesia was also reviewed. The risks                            and benefits of the procedure and the sedation                            options and risks were discussed with the patient.                            All questions were answered, and informed consent                            was obtained. Prior Anticoagulants: The patient has                            taken no anticoagulant or antiplatelet agents. ASA                            Grade Assessment: II - A patient with mild systemic                            disease. After reviewing the risks and benefits,                            the patient was deemed in satisfactory condition to  undergo the procedure.                           After obtaining informed consent, the colonoscope                            was passed under direct vision. Throughout the                            procedure, the patient's blood pressure, pulse, and                            oxygen saturations were monitored continuously. The                            CF [id number] was  introduced through the anus                            and advanced to the the cecum, identified by                            appendiceal orifice and ileocecal valve. The                            colonoscopy was performed without difficulty. The                            patient tolerated the procedure well. The quality                            of the bowel preparation was good. The ileocecal                            valve, appendiceal orifice, and rectum were                            photographed. Scope In: 9:58:02 AM Scope Out: 10:08:31 AM Scope Withdrawal Time: 0 hours 8 minutes 17 seconds  Total Procedure Duration: 0 hours 10 minutes 29 seconds  Findings:                 The perianal and digital rectal examinations were                            normal.                           Repeat examination of right colon under NBI                            performed.                           An 8 mm polyp was found in the proximal ascending  colon. The polyp was semi-sessile. The polyp was                            removed with a cold snare. Resection and retrieval                            were complete.                           Multiple diverticula were found in the left colon                            and right colon.                           The exam was otherwise without abnormality on                            direct and retroflexion views. Complications:            No immediate complications. Estimated Blood Loss:     Estimated blood loss was minimal. Impression:               - One 8 mm polyp in the proximal ascending colon,                            removed with a cold snare. Resected and retrieved.                           - Diverticulosis in the left colon and in the right                            colon.                           - The examination was otherwise normal on direct                            and retroflexion  views. Recommendation:           - Patient has a contact number available for                            emergencies. The signs and symptoms of potential                            delayed complications were discussed with the                            patient. Return to normal activities tomorrow.                            Written discharge instructions were provided to the                            patient.                           -  Resume previous diet.                           - Continue present medications.                           - Await pathology results.                           - Repeat colonoscopy in 5 years for surveillance                            and family history. Rogelio Waynick L. Loletha Carrow, MD 01/25/2023 10:11:59 AM This report has been signed electronically.

## 2023-01-25 NOTE — Progress Notes (Signed)
A and O x3. Report to RN. Tolerated MAC anesthesia well. 

## 2023-01-25 NOTE — Patient Instructions (Addendum)
Resume previous diet. Continue present medications. Await pathology results. Repeat colonoscopy in 5 years for surveillance and family history.  YOU HAD AN ENDOSCOPIC PROCEDURE TODAY AT [facility]:   Refer to the procedure report that was given to you for any specific questions about what was found during the examination.  If the procedure report does not answer your questions, please call your gastroenterologist to clarify.  If you requested that your care partner not be given the details of your procedure findings, then the procedure report has been included in a sealed envelope for you to review at your convenience later.  YOU SHOULD EXPECT: Some feelings of bloating in the abdomen. Passage of more gas than usual.  Walking can help get rid of the air that was put into your GI tract during the procedure and reduce the bloating. If you had a lower endoscopy (such as a colonoscopy or flexible sigmoidoscopy) you may notice spotting of blood in your stool or on the toilet paper. If you underwent a bowel prep for your procedure, you may not have a normal bowel movement for a few days.  Please Note:  You might notice some irritation and congestion in your nose or some drainage.  This is from the oxygen used during your procedure.  There is no need for concern and it should clear up in a day or so.  SYMPTOMS TO REPORT IMMEDIATELY:  Following lower endoscopy (colonoscopy or flexible sigmoidoscopy):  Excessive amounts of blood in the stool  Significant tenderness or worsening of abdominal pains  Swelling of the abdomen that is new, acute  Fever of 100F or higher   For urgent or emergent issues, a gastroenterologist can be reached at any hour by calling [phone]. Do not use MyChart messaging for urgent concerns.    DIET:  We do recommend a small meal at first, but then you may proceed to your regular diet.  Drink plenty of fluids but you should avoid alcoholic beverages for  24 hours.  ACTIVITY:  You should plan to take it easy for the rest of today and you should NOT DRIVE or use heavy machinery until tomorrow (because of the sedation medicines used during the test).    FOLLOW UP: Our staff will call the number listed on your records the next business day following your procedure.  We will call around 7:15- 8:00 am to check on you and address any questions or concerns that you may have regarding the information given to you following your procedure. If we do not reach you, we will leave a message.     If any biopsies were taken you will be contacted by phone or by letter within the next 1-3 weeks.  Please call us at [phone] if you have not heard about the biopsies in 3 weeks.    SIGNATURES/CONFIDENTIALITY: You and/or your care partner have signed paperwork which will be entered into your electronic medical record.  These signatures attest to the fact that that the information above on your After Visit Summary has been reviewed and is understood.  Full responsibility of the confidentiality of this discharge information lies with you and/or your care-partner.

## 2023-01-25 NOTE — Progress Notes (Signed)
History and Physical:  This patient presents for endoscopic testing for: Encounter Diagnoses  Name Primary?   Family history of colon cancer Yes   Special screening for malignant neoplasms, colon     Sister had CRC. No polyps for this patient last colonoscopy in [facility] Aug 2018 Patient denies chronic abdominal pain, rectal bleeding, constipation or diarrhea.   Patient is otherwise without complaints or active issues today.   Past Medical History: Past Medical History:  Diagnosis Date   Bulging lumbar disc    H/O: hysterectomy 2010   Complete   Migraines      Past Surgical History: Past Surgical History:  Procedure Laterality Date   BACK SURGERY  2010   BUNIONECTOMY     COLONOSCOPY      Allergies: Allergies  Allergen Reactions   Grass Pollen(K-O-R-T-Swt Vern) Other (See Comments)   Pollen Extract Other (See Comments)   Codeine Nausea And Vomiting    Outpatient Meds: Current Outpatient Medications  Medication Sig Dispense Refill   COMBIGAN 0.2-0.5 % ophthalmic solution Apply 1 drop to eye 2 (two) times daily.     gabapentin (NEURONTIN) 300 MG capsule Take 1 capsule (300 mg total) by mouth 3 (three) times daily. 180 capsule 1   Multiple Vitamin (MULTIVITAMIN) tablet Take 1 tablet by mouth daily.     rizatriptan (MAXALT) 5 MG tablet Take 1 tablet (5 mg total) by mouth as needed for migraine. May repeat in 2 hours if needed (Patient not taking: Reported on 01/07/2023) 30 tablet 3   Current Facility-Administered Medications  Medication Dose Route Frequency Provider Last Rate Last Admin   0.9 %  sodium chloride infusion  500 mL Intravenous Once Danis, Kirke Corin, MD          ___________________________________________________________________ Objective   Exam:  BP (!) 106/51   Pulse (!) 58   Temp (!) 97.5 F (36.4 C) (Temporal)   Ht 5' 9"$  (1.753 m)   Wt 230 lb (104.3 kg)   SpO2 99%   BMI 33.97 kg/m   CV: regular , S1/S2 Resp: clear to auscultation  bilaterally, normal RR and effort noted GI: soft, no tenderness, with active bowel sounds.   Assessment: Encounter Diagnoses  Name Primary?   Family history of colon cancer Yes   Special screening for malignant neoplasms, colon      Plan: Colonoscopy  The benefits and risks of the planned procedure were described in detail with the patient or (when appropriate) their health care proxy.  Risks were outlined as including, but not limited to, bleeding, infection, perforation, adverse medication reaction leading to cardiac or pulmonary decompensation, pancreatitis (if ERCP).  The limitation of incomplete mucosal visualization was also discussed.  No guarantees or warranties were given.    The patient is appropriate for an endoscopic procedure in the ambulatory setting.   - Wilfrid Lund, MD

## 2023-01-25 NOTE — Progress Notes (Signed)
VS completed by DT.  Pt's states no medical or surgical changes since previsit or office visit.  

## 2023-01-26 ENCOUNTER — Telehealth: Payer: Self-pay

## 2023-01-26 NOTE — Telephone Encounter (Signed)
  Follow up Call-     01/25/2023    9:03 AM  Call back number  Post procedure Call Back phone  # [phone]  Permission to leave phone message Yes     Patient questions:  Do you have a fever, pain , or abdominal swelling? No. Pain Score  0 *  Have you tolerated food without any problems? Yes.    Have you been able to return to your normal activities? Yes.    Do you have any questions about your discharge instructions: Diet   No. Medications  No. Follow up visit  No.  Do you have questions or concerns about your Care? No.  Actions: * If pain score is 4 or above: No action needed, pain <4.

## 2023-01-27 ENCOUNTER — Other Ambulatory Visit: Payer: Self-pay | Admitting: Adult Health

## 2023-01-27 DIAGNOSIS — G43109 Migraine with aura, not intractable, without status migrainosus: Secondary | ICD-10-CM

## 2023-01-28 ENCOUNTER — Ambulatory Visit: Payer: BLUE CROSS/BLUE SHIELD | Admitting: Adult Health

## 2023-02-01 ENCOUNTER — Encounter: Payer: Self-pay | Admitting: Adult Health

## 2023-02-01 ENCOUNTER — Encounter: Payer: Self-pay | Admitting: Gastroenterology

## 2023-02-01 ENCOUNTER — Ambulatory Visit: Payer: Managed Care, Other (non HMO) | Admitting: Adult Health

## 2023-02-01 VITALS — BP 129/80 | HR 78 | Temp 97.5°F | Resp 18 | Ht 69.5 in | Wt 227.1 lb

## 2023-02-01 DIAGNOSIS — Z6833 Body mass index (BMI) 33.0-33.9, adult: Secondary | ICD-10-CM

## 2023-02-01 DIAGNOSIS — G43109 Migraine with aura, not intractable, without status migrainosus: Secondary | ICD-10-CM | POA: Diagnosis not present

## 2023-02-01 DIAGNOSIS — Z91013 Allergy to seafood: Secondary | ICD-10-CM | POA: Diagnosis not present

## 2023-02-01 DIAGNOSIS — E669 Obesity, unspecified: Secondary | ICD-10-CM | POA: Diagnosis not present

## 2023-02-01 DIAGNOSIS — M5442 Lumbago with sciatica, left side: Secondary | ICD-10-CM

## 2023-02-01 DIAGNOSIS — G8929 Other chronic pain: Secondary | ICD-10-CM

## 2023-02-01 DIAGNOSIS — M5441 Lumbago with sciatica, right side: Secondary | ICD-10-CM

## 2023-02-01 MED ORDER — EPINEPHRINE 0.3 MG/0.3ML IJ SOAJ: 0.3000 mg | INTRAMUSCULAR | 3 refills | Status: AC | PRN

## 2023-02-01 MED ORDER — UBRELVY 100 MG PO TABS: 100.0000 mg | ORAL_TABLET | Freq: Every day | ORAL | 0 refills | Status: DC | PRN

## 2023-02-01 NOTE — Patient Instructions (Signed)
Migraine Headache A migraine headache is a very strong throbbing pain on one side or both sides of your head. This type of headache can also cause other symptoms. It can last from 4 hours to 3 days. Talk with your doctor about what things may bring on (trigger) this condition. What are the causes? The exact cause of this condition is not known. This condition may be triggered or caused by: Drinking alcohol. Smoking. Taking medicines, such as: Medicine used to treat chest pain (nitroglycerin). Birth control pills. Estrogen. Some blood pressure medicines. Eating or drinking certain products. Doing physical activity. Other things that may trigger a migraine headache include: Having a menstrual period. Pregnancy. Hunger. Stress. Not getting enough sleep or getting too much sleep. Weather changes. Tiredness (fatigue). What increases the risk? Being 37-91 years old. Being female. Having a family history of migraine headaches. Being Caucasian. Having depression or anxiety. Being very overweight. What are the signs or symptoms? A throbbing pain. This pain may: Happen in any area of the head, such as on one side or both sides. Make it hard to do daily activities. Get worse with physical activity. Get worse around bright lights or loud noises. Other symptoms may include: Feeling sick to your stomach (nauseous). Vomiting. Dizziness. Being sensitive to bright lights, loud noises, or smells. Before you get a migraine headache, you may get warning signs (an aura). An aura may include: Seeing flashing lights or having blind spots. Seeing bright spots, halos, or zigzag lines. Having tunnel vision or blurred vision. Having numbness or a tingling feeling. Having trouble talking. Having weak muscles. Some people have symptoms after a migraine headache (postdromal phase), such as: Tiredness. Trouble thinking (concentrating). How is this treated? Taking medicines that: Relieve  pain. Relieve the feeling of being sick to your stomach. Prevent migraine headaches. Treatment may also include: Having acupuncture. Avoiding foods that bring on migraine headaches. Learning ways to control your body functions (biofeedback). Therapy to help you know and deal with negative thoughts (cognitive behavioral therapy). Follow these instructions at home: Medicines Take over-the-counter and prescription medicines only as told by your doctor. Ask your doctor if the medicine prescribed to you: Requires you to avoid driving or using heavy machinery. Can cause trouble pooping (constipation). You may need to take these steps to prevent or treat trouble pooping: Drink enough fluid to keep your pee (urine) pale yellow. Take over-the-counter or prescription medicines. Eat foods that are high in fiber. These include beans, whole grains, and fresh fruits and vegetables. Limit foods that are high in fat and sugar. These include fried or sweet foods. Lifestyle Do not drink alcohol. Do not use any products that contain nicotine or tobacco, such as cigarettes, e-cigarettes, and chewing tobacco. If you need help quitting, ask your doctor. Get at least 8 hours of sleep every night. Limit and deal with stress. General instructions Keep a journal to find out what may bring on your migraine headaches. For example, write down: What you eat and drink. How much sleep you get. Any change in what you eat or drink. Any change in your medicines. If you have a migraine headache: Avoid things that make your symptoms worse, such as bright lights. It may help to lie down in a dark, quiet room. Do not drive or use heavy machinery. Ask your doctor what activities are safe for you. Keep all follow-up visits as told by your doctor. This is important. Contact a doctor if: You get a migraine headache that is different  or worse than others you have had. You have more than 15 headache days in one month. Get  help right away if: Your migraine headache gets very bad. Your migraine headache lasts longer than 72 hours. You have a fever. You have a stiff neck. You have trouble seeing. Your muscles feel weak or like you cannot control them. You start to lose your balance a lot. You start to have trouble walking. You pass out (faint). You have a seizure. Summary A migraine headache is a very strong throbbing pain on one side or both sides of your head. These headaches can also cause other symptoms. This condition may be treated with medicines and changes to your lifestyle. Keep a journal to find out what may bring on your migraine headaches. Contact a doctor if you get a migraine headache that is different or worse than others you have had. Contact your doctor if you have more than 15 headache days in a month. This information is not intended to replace advice given to you by your health care provider. Make sure you discuss any questions you have with your health care provider. Document Revised: 05/14/2022 Document Reviewed: 01/12/2019 Elsevier Patient Education  [place].

## 2023-02-01 NOTE — Progress Notes (Signed)
[facility] clinic  Provider: Durenda Age DNP  Code Status:  Full Code  Goals of Care:     11/26/2022   12:48 PM  Advanced Directives  Does Patient Have a Medical Advance Directive? No  Would patient like information on creating a medical advance directive? No - Patient declined     Chief Complaint  Patient presents with   Follow-up    Two month follow-up    HPI: Patient is a 57 y.o. female seen today for management of chronic issues. 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, used to take Epipen PRN  Chronic bilateral low back pain with bilateral sciatica  - takes Neurontin  Wt Readings from Last 3 Encounters:  02/01/23 227 lb 2 oz (103 kg)  01/25/23 230 lb (104.3 kg)  01/07/23 230 lb (104.3 kg)     Past Medical History:  Diagnosis Date   Bulging lumbar disc    H/O: hysterectomy 2010   Complete   Migraines     Past Surgical History:  Procedure Laterality Date   BACK SURGERY  2010   BUNIONECTOMY     COLONOSCOPY      Allergies  Allergen Reactions   Grass Pollen(K-O-R-T-Swt Vern) Other (See Comments)   Pollen Extract Other (See Comments)   Codeine Nausea And Vomiting    Outpatient Encounter Medications as of 02/01/2023  Medication Sig   COMBIGAN 0.2-0.5 % ophthalmic solution Apply 1 drop to eye 2 (two) times daily.   gabapentin (NEURONTIN) 300 MG capsule Take 1 capsule (300 mg total) by mouth 3 (three) times daily.   Multiple Vitamin (MULTIVITAMIN) tablet Take 1 tablet by mouth daily.   rizatriptan (MAXALT) 5 MG tablet Take 1 tablet (5 mg total) by mouth as needed for migraine. May repeat in 2 hours if needed   No facility-administered encounter medications on file as of 02/01/2023.    Review of Systems:  Review of Systems   Constitutional:  Negative for appetite change, chills, fatigue and fever.  HENT:  Negative for congestion, hearing loss, rhinorrhea and sore throat.   Eyes: Negative.   Respiratory:  Negative for cough, shortness of breath and wheezing.   Cardiovascular:  Negative for chest pain, palpitations and leg swelling.  Gastrointestinal:  Negative for abdominal pain, constipation, diarrhea, nausea and vomiting.  Genitourinary:  Negative for dysuria.  Musculoskeletal:  Negative for arthralgias, back pain and myalgias.  Skin:  Negative for color change, rash and wound.  Neurological:  Positive for headaches. Negative for dizziness and weakness.  Psychiatric/Behavioral:  Negative for behavioral problems. The patient is not nervous/anxious.     Health Maintenance  Topic Date Due   FOOT EXAM  Never done   OPHTHALMOLOGY EXAM  Never done   HIV Screening  Never done   Diabetic kidney evaluation - Urine ACR  Never done   Hepatitis C Screening  Never done   DTaP/Tdap/Td (1 - Tdap) Never done   PAP SMEAR-Modifier  Never done   Zoster Vaccines- Shingrix (1 of 2) Never done   COVID-19 Vaccine (4 - 2023-24 season) 08/14/2022   INFLUENZA VACCINE  03/14/2023 (Originally 07/14/2022)   HEMOGLOBIN A1C  05/29/2023   Diabetic kidney evaluation - eGFR measurement  11/28/2023   MAMMOGRAM  05/19/2024   COLONOSCOPY (Pts 45-52yr Insurance coverage will  need to be confirmed)  01/25/2033   HPV VACCINES  Aged Out    Physical Exam: Vitals:   02/01/23 0834  BP: 129/80  Pulse: 78  Resp: 18  Temp: (!) 97.5 F (36.4 C)  SpO2: 98%  Weight: 227 lb 2 oz (103 kg)  Height: 5' 9.5" (1.765 m)   Body mass index is 33.06 kg/m. Physical Exam Constitutional:      Appearance: She is obese.  HENT:     Head: Normocephalic and atraumatic.     Nose: Nose normal.     Mouth/Throat:     Mouth: Mucous membranes are moist.  Eyes:     Conjunctiva/sclera: Conjunctivae normal.  Cardiovascular:     Rate and Rhythm: Normal  rate and regular rhythm.  Pulmonary:     Effort: Pulmonary effort is normal.     Breath sounds: Normal breath sounds.  Abdominal:     General: Bowel sounds are normal.     Palpations: Abdomen is soft.  Musculoskeletal:        General: Normal range of motion.     Cervical back: Normal range of motion.  Skin:    General: Skin is warm and dry.  Neurological:     General: No focal deficit present.     Mental Status: She is alert and oriented to person, place, and time.  Psychiatric:        Mood and Affect: Mood normal.        Behavior: Behavior normal.        Thought Content: Thought content normal.        Judgment: Judgment normal.     Labs reviewed: Basic Metabolic Panel: Recent Labs    11/27/22 0942  NA 140  K 4.4  CL 107  CO2 26  GLUCOSE 136*  BUN 17  CREATININE 0.85  CALCIUM 9.6  TSH 1.00   Liver Function Tests: Recent Labs    11/27/22 0942  AST 18  ALT 15  BILITOT 0.7  PROT 7.0   No results for input(s): "LIPASE", "AMYLASE" in the last 8760 hours. No results for input(s): "AMMONIA" in the last 8760 hours. CBC: Recent Labs    11/27/22 0942  WBC 5.3  NEUTROABS 3,487  HGB 14.1  HCT 42.6  MCV 91.8  PLT 275   Lipid Panel: Recent Labs    11/27/22 0942  CHOL 215*  HDL 56  LDLCALC 137*  TRIG 108  CHOLHDL 3.8   Lab Results  Component Value Date   HGBA1C 6.6 (H) 11/27/2022    Procedures since last visit: No results found.  Assessment/Plan  1. Migraine with aura and without status migrainosus, not intractable -  Maxalt was not working -  has 10-12X migraine/month - Ubrogepant (UBRELVY) 100 MG TABS; Take 1 tablet (100 mg total) by mouth daily as needed.  Dispense: 30 tablet; Refill: 0  2. Allergy history, seafood - EPINEPHrine (EPIPEN 2-PAK) 0.3 mg/0.3 mL IJ SOAJ injection; Inject 0.3 mg into the muscle as needed for anaphylaxis.  Dispense: 1 each; Refill: 3  3. Chronic bilateral low back pain with bilateral sciatica -  stable, continue  Neurontin  4. Class 1 obesity with body mass index (BMI) of 33.0 to 33.9 in adult, unspecified obesity type, unspecified whether serious comorbidity present Body mass index is 33.06 kg/m. -  nephew recently died and wasn't feeling good, was not active -  plans to have 20 lbs in 6 months     Labs/tests ordered:  None  Next  appt:  6 months

## 2023-02-03 ENCOUNTER — Telehealth: Payer: Self-pay

## 2023-02-03 NOTE — Telephone Encounter (Signed)
Incoming fax received from patients pharmacy to initiate a prior authorization for Ubrelvy 100 mg tablet                   .  PA initiated through covermymeds. Key: [id number]  See message below:    Outgoing call placed to [phone] (number on the back of insurance card) as instructed above. I was then redirected to call [phone] to inquire about Prior Authorization through [employer], option 3, then option 1.  Spoke with Myieshia and it was determined after 16 minutes of being on the call that PA needs to be completed through [email or web address]  I will have to complete PA later

## 2023-02-04 NOTE — Telephone Encounter (Signed)
PA completed via [email or web address]

## 2023-02-04 NOTE — Telephone Encounter (Signed)
Awaiting response from the insurance company

## 2023-02-08 ENCOUNTER — Other Ambulatory Visit: Payer: Self-pay | Admitting: Adult Health

## 2023-02-08 DIAGNOSIS — G8929 Other chronic pain: Secondary | ICD-10-CM

## 2023-02-26 ENCOUNTER — Ambulatory Visit: Payer: Managed Care, Other (non HMO) | Admitting: Adult Health

## 2023-02-26 ENCOUNTER — Encounter: Payer: Self-pay | Admitting: Adult Health

## 2023-02-26 VITALS — BP 122/78 | HR 71 | Temp 97.1°F | Resp 20 | Ht 69.5 in | Wt 224.0 lb

## 2023-02-26 DIAGNOSIS — F419 Anxiety disorder, unspecified: Secondary | ICD-10-CM

## 2023-02-26 MED ORDER — ALPRAZOLAM 0.5 MG PO TABS: 0.5000 mg | ORAL_TABLET | Freq: Every evening | ORAL | 0 refills | Status: DC | PRN

## 2023-02-26 NOTE — Progress Notes (Unsigned)
[facility] clinic  Provider:   Code Status:  Full Code  Goals of Care:     11/26/2022   12:48 PM  Advanced Directives  Does Patient Have a Medical Advance Directive? No  Would patient like information on creating a medical advance directive? No - Patient declined     Chief Complaint  Patient presents with   Acute Visit    Anxiety     HPI: Patient is a 57 y.o. female seen today for an acute visit for  Past Medical History:  Diagnosis Date   Bulging lumbar disc    H/O: hysterectomy 2010   Complete   Migraines     Past Surgical History:  Procedure Laterality Date   BACK SURGERY  2010   BUNIONECTOMY     COLONOSCOPY      Allergies  Allergen Reactions   Grass Pollen(K-O-R-T-Swt Vern) Other (See Comments)   Pollen Extract Other (See Comments)   Codeine Nausea And Vomiting    Outpatient Encounter Medications as of 02/26/2023  Medication Sig   COMBIGAN 0.2-0.5 % ophthalmic solution Apply 1 drop to eye 2 (two) times daily.   EPINEPHrine (EPIPEN 2-PAK) 0.3 mg/0.3 mL IJ SOAJ injection Inject 0.3 mg into the muscle as needed for anaphylaxis.   gabapentin (NEURONTIN) 300 MG capsule TAKE 1 CAPSULE BY MOUTH THREE TIMES A DAY   Multiple Vitamin (MULTIVITAMIN) tablet Take 1 tablet by mouth daily.   Ubrogepant (UBRELVY) 100 MG TABS Take 1 tablet (100 mg total) by mouth daily as needed.   No facility-administered encounter medications on file as of 02/26/2023.    Review of Systems:  Review of Systems  Constitutional:  Negative for appetite change, chills, fatigue and fever.  HENT:  Negative for congestion, hearing loss, rhinorrhea and sore throat.   Eyes: Negative.   Respiratory:  Negative for cough, shortness of breath and wheezing.   Cardiovascular:  Negative for chest pain, palpitations and leg swelling.  Gastrointestinal:  Negative for abdominal pain, constipation, diarrhea, nausea and vomiting.  Genitourinary:  Negative for dysuria.  Musculoskeletal:  Negative for  arthralgias, back pain and myalgias.  Skin:  Negative for color change, rash and wound.  Neurological:  Negative for dizziness, weakness and headaches.  Psychiatric/Behavioral:  Negative for behavioral problems. The patient is nervous/anxious.     Health Maintenance  Topic Date Due   FOOT EXAM  Never done   OPHTHALMOLOGY EXAM  Never done   HIV Screening  Never done   Diabetic kidney evaluation - Urine ACR  Never done   Hepatitis C Screening  Never done   DTaP/Tdap/Td (1 - Tdap) Never done   PAP SMEAR-Modifier  Never done   Zoster Vaccines- Shingrix (1 of 2) Never done   COVID-19 Vaccine (4 - 2023-24 season) 08/14/2022   INFLUENZA VACCINE  03/14/2023 (Originally 07/14/2022)   HEMOGLOBIN A1C  05/29/2023   Diabetic kidney evaluation - eGFR measurement  11/28/2023   MAMMOGRAM  05/19/2024   COLONOSCOPY (Pts 45-61yrs Insurance coverage will need to be confirmed)  01/26/2028   HPV VACCINES  Aged Out    Physical Exam: Vitals:   02/26/23 1057  BP: 122/78  Pulse: 71  Resp: 20  Temp: (!) 97.1 F (36.2 C)  SpO2: 94%  Weight: 224 lb (101.6 kg)  Height: 5' 9.5" (1.765 m)   Body mass index is 32.6 kg/m. Physical Exam Constitutional:      General: She is not in acute distress.    Appearance: She is obese.  HENT:  Head: Normocephalic and atraumatic.     Nose: Nose normal.     Mouth/Throat:     Mouth: Mucous membranes are moist.  Eyes:     Conjunctiva/sclera: Conjunctivae normal.  Cardiovascular:     Rate and Rhythm: Normal rate and regular rhythm.  Pulmonary:     Effort: Pulmonary effort is normal.     Breath sounds: Normal breath sounds.  Abdominal:     General: Bowel sounds are normal.     Palpations: Abdomen is soft.  Musculoskeletal:        General: Normal range of motion.     Cervical back: Normal range of motion.  Skin:    General: Skin is warm and dry.  Neurological:     General: No focal deficit present.     Mental Status: She is alert and oriented to person,  place, and time.  Psychiatric:        Mood and Affect: Mood normal.        Behavior: Behavior normal.        Thought Content: Thought content normal.        Judgment: Judgment normal.     Labs reviewed: Basic Metabolic Panel: Recent Labs    11/27/22 0942  NA 140  K 4.4  CL 107  CO2 26  GLUCOSE 136*  BUN 17  CREATININE 0.85  CALCIUM 9.6  TSH 1.00   Liver Function Tests: Recent Labs    11/27/22 0942  AST 18  ALT 15  BILITOT 0.7  PROT 7.0   No results for input(s): "LIPASE", "AMYLASE" in the last 8760 hours. No results for input(s): "AMMONIA" in the last 8760 hours. CBC: Recent Labs    11/27/22 0942  WBC 5.3  NEUTROABS 3,487  HGB 14.1  HCT 42.6  MCV 91.8  PLT 275   Lipid Panel: Recent Labs    11/27/22 0942  CHOL 215*  HDL 56  LDLCALC 137*  TRIG 108  CHOLHDL 3.8   Lab Results  Component Value Date   HGBA1C 6.6 (H) 11/27/2022    Procedures since last visit: No results found.  Assessment/Plan     Labs/tests ordered:    Next appt:  08/02/2023

## 2023-02-26 NOTE — Patient Instructions (Signed)

## 2023-04-29 ENCOUNTER — Other Ambulatory Visit: Payer: Self-pay | Admitting: Adult Health

## 2023-04-29 DIAGNOSIS — Z1231 Encounter for screening mammogram for malignant neoplasm of breast: Secondary | ICD-10-CM

## 2023-05-24 ENCOUNTER — Other Ambulatory Visit: Payer: Self-pay | Admitting: Adult Health

## 2023-05-24 DIAGNOSIS — G43109 Migraine with aura, not intractable, without status migrainosus: Secondary | ICD-10-CM

## 2023-05-27 ENCOUNTER — Ambulatory Visit
Admission: RE | Admit: 2023-05-27 | Discharge: 2023-05-27 | Disposition: A | Discharge status: Home / Self Care | Payer: Managed Care, Other (non HMO) | Source: Ambulatory Visit | Attending: Adult Health | Admitting: Adult Health

## 2023-05-27 DIAGNOSIS — Z1231 Encounter for screening mammogram for malignant neoplasm of breast: Secondary | ICD-10-CM

## 2023-06-02 NOTE — Progress Notes (Signed)
-    mammogram negative for malignancy

## 2023-08-02 ENCOUNTER — Ambulatory Visit: Payer: Managed Care, Other (non HMO) | Admitting: Adult Health

## 2023-08-29 IMAGING — MG MM DIGITAL SCREENING BILAT W/ TOMO AND CAD
6 of 12 series · 6 of 36 positions shown · non-contrast
Comparison: Previous exam(s).

CLINICAL DATA: Screening.

EXAM:
DIGITAL SCREENING BILATERAL MAMMOGRAM WITH TOMOSYNTHESIS AND CAD
TECHNIQUE: Bilateral screening digital craniocaudal and mediolateral oblique
mammograms were obtained. Bilateral screening digital breast
tomosynthesis was performed. The images were evaluated with
computer-aided detection.

[R MLO synth-2D (1 of 2)]
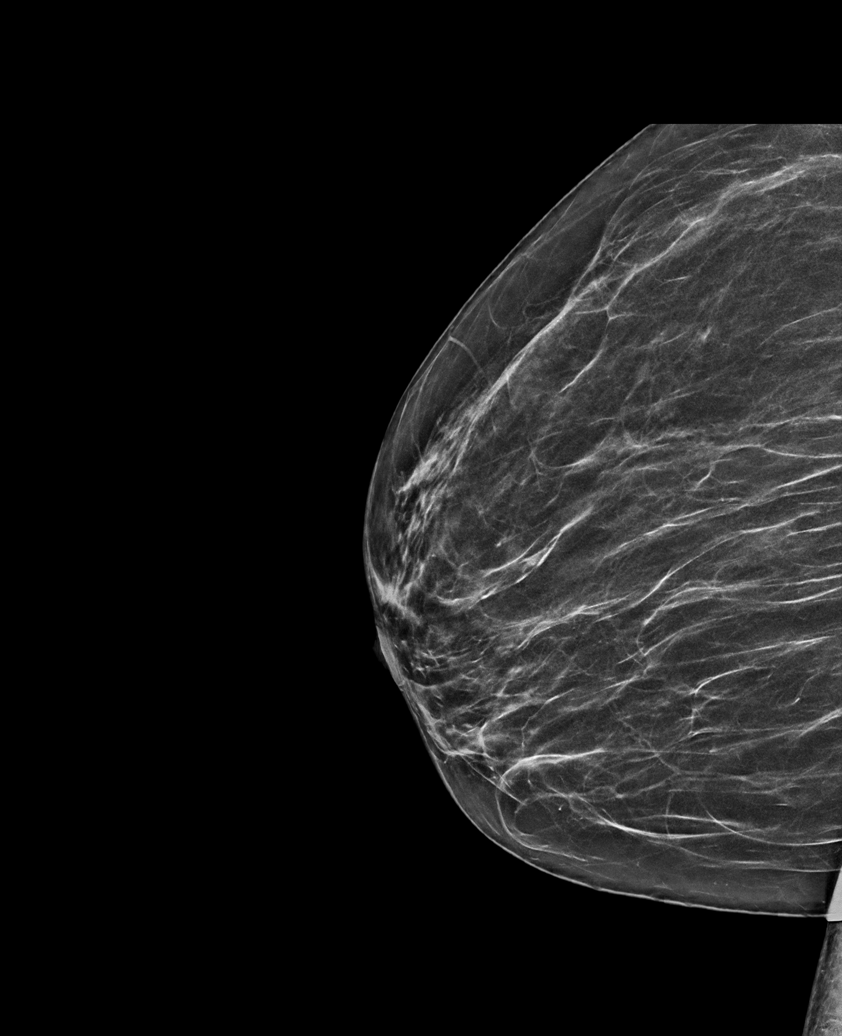

[L MLO synth-2D (1 of 2)]
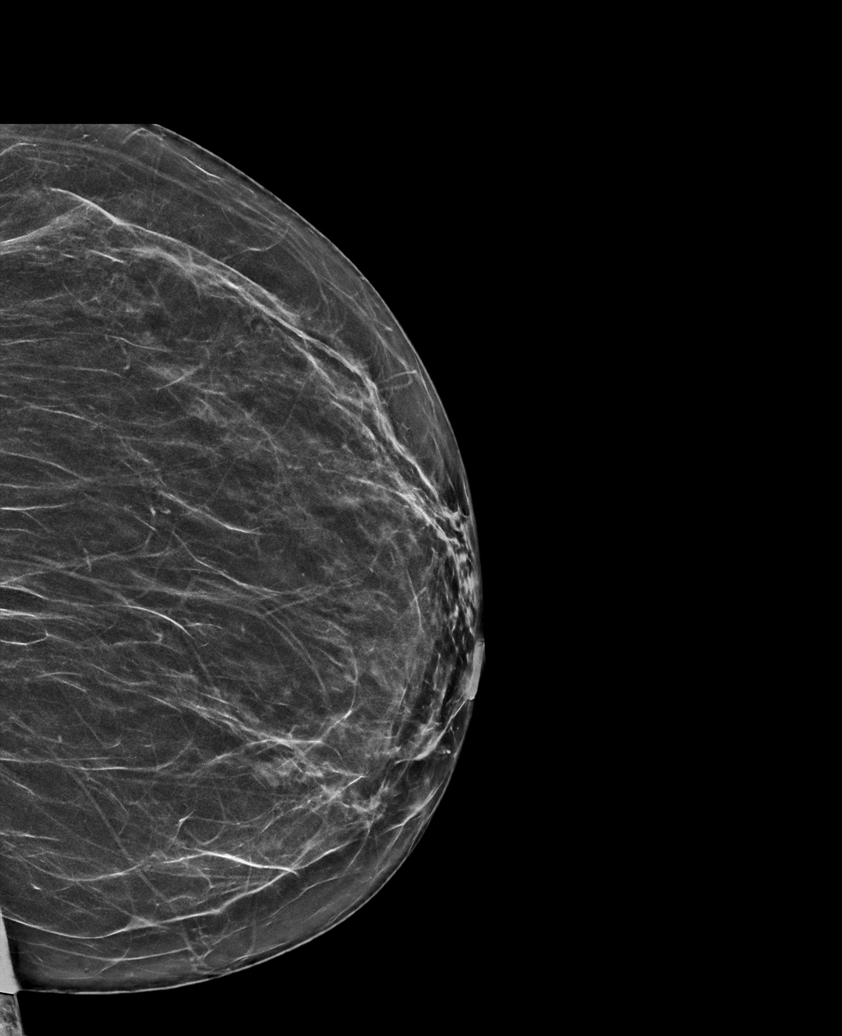

[R MLO synth-2D (2 of 2)]
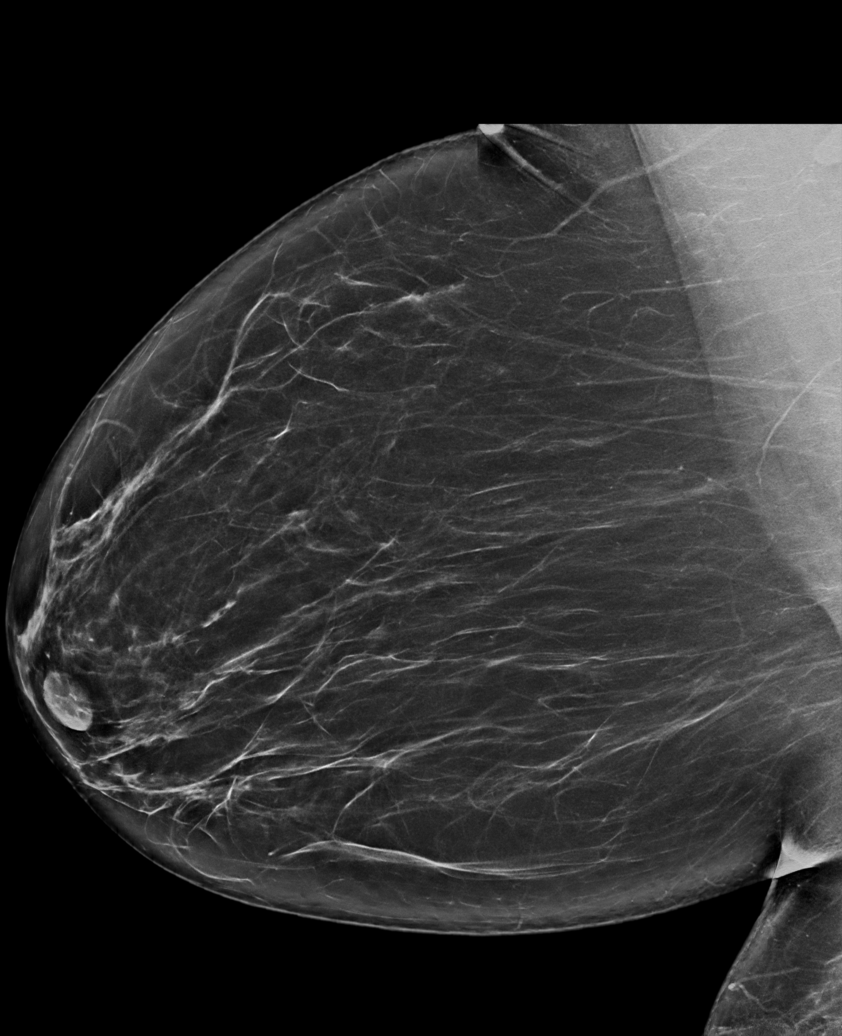

[L MLO synth-2D (2 of 2)]
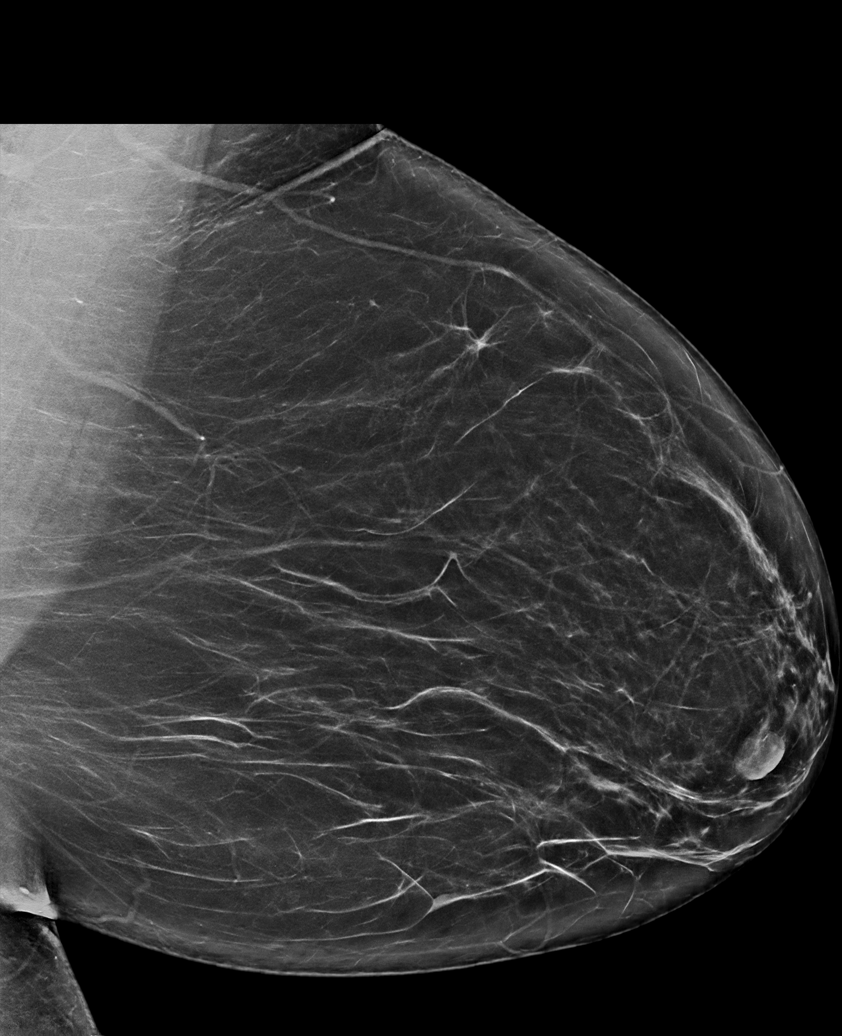

[L CC synth-2D]
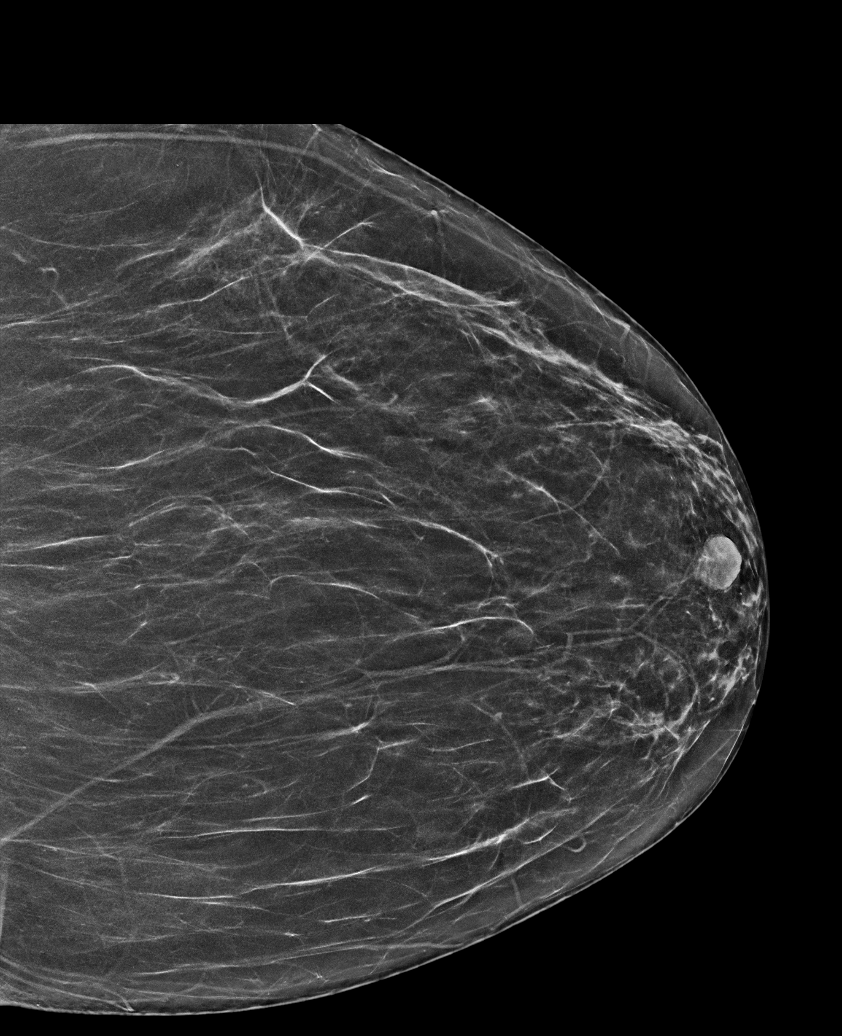

[R CC synth-2D]
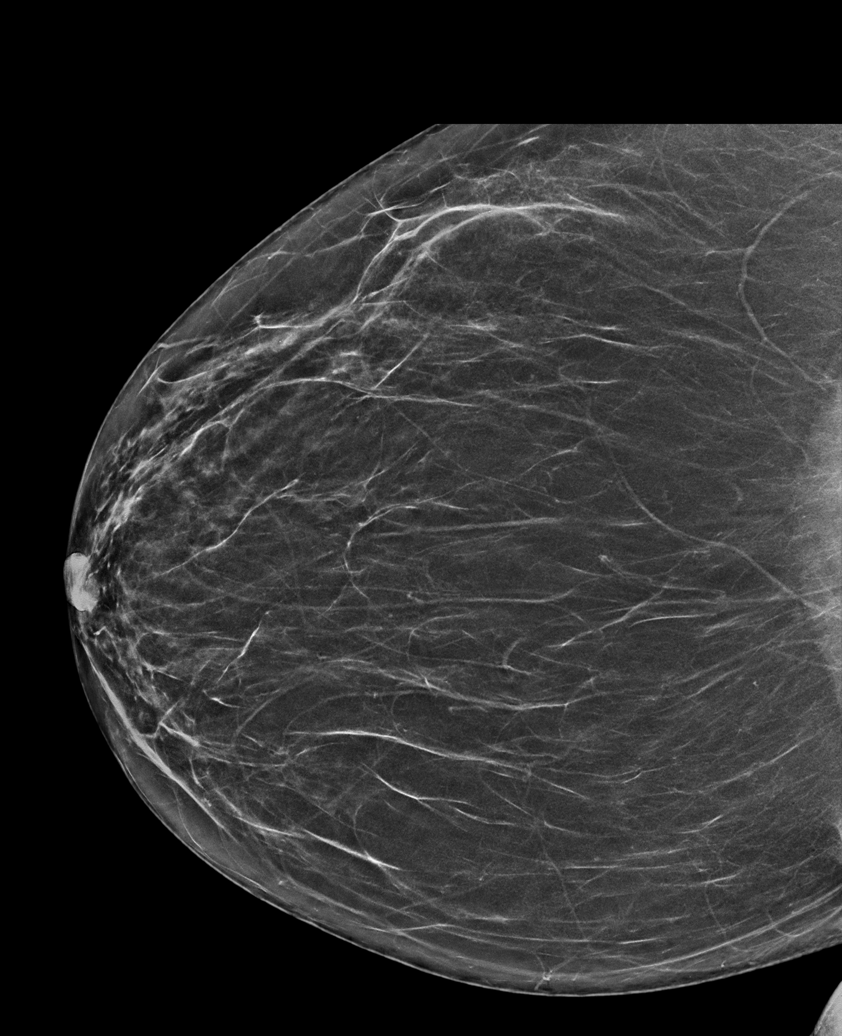

[6 of 36 positions shown; findings below may reference images not displayed]

ACR Breast Density Category b: There are scattered areas of
fibroglandular density.
FINDINGS: There are no findings suspicious for malignancy.
IMPRESSION: No mammographic evidence of malignancy. A result letter of this
screening mammogram will be mailed directly to the patient.

RECOMMENDATION:
Screening mammogram in one year. (Code:51-O-LD2)

BI-RADS CATEGORY  1: Negative.

## 2023-09-16 ENCOUNTER — Encounter: Payer: Self-pay | Admitting: Adult Health

## 2023-09-16 ENCOUNTER — Ambulatory Visit: Payer: Managed Care, Other (non HMO) | Admitting: Adult Health

## 2023-09-16 VITALS — BP 132/86 | HR 63 | Temp 97.6°F | Resp 17 | Ht 69.5 in | Wt 234.0 lb

## 2023-09-16 DIAGNOSIS — I10 Essential (primary) hypertension: Secondary | ICD-10-CM | POA: Diagnosis not present

## 2023-09-16 DIAGNOSIS — G43109 Migraine with aura, not intractable, without status migrainosus: Secondary | ICD-10-CM

## 2023-09-16 DIAGNOSIS — M5441 Lumbago with sciatica, right side: Secondary | ICD-10-CM

## 2023-09-16 DIAGNOSIS — E7800 Pure hypercholesterolemia, unspecified: Secondary | ICD-10-CM

## 2023-09-16 DIAGNOSIS — M5442 Lumbago with sciatica, left side: Secondary | ICD-10-CM

## 2023-09-16 DIAGNOSIS — E1142 Type 2 diabetes mellitus with diabetic polyneuropathy: Secondary | ICD-10-CM

## 2023-09-16 DIAGNOSIS — Z2821 Immunization not carried out because of patient refusal: Secondary | ICD-10-CM

## 2023-09-16 DIAGNOSIS — G8929 Other chronic pain: Secondary | ICD-10-CM

## 2023-09-16 DIAGNOSIS — Z6834 Body mass index (BMI) 34.0-34.9, adult: Secondary | ICD-10-CM

## 2023-09-16 DIAGNOSIS — E66811 Obesity, class 1: Secondary | ICD-10-CM

## 2023-09-16 MED ORDER — LISINOPRIL 10 MG PO TABS
10.0000 mg | ORAL_TABLET | Freq: Every day | ORAL | 3 refills | Status: AC
Start: 2023-09-16 — End: ?

## 2023-09-16 MED ORDER — NURTEC 75 MG PO TBDP
75.0000 mg | ORAL_TABLET | ORAL | 1 refills | Status: DC
Start: 2023-09-16 — End: 2024-04-10

## 2023-09-16 MED ORDER — LISINOPRIL 10 MG PO TABS
10.0000 mg | ORAL_TABLET | Freq: Every day | ORAL | 3 refills | Status: DC
Start: 2023-09-16 — End: 2023-09-16

## 2023-09-16 MED ORDER — LISINOPRIL 10 MG PO TABS: 10.0000 mg | ORAL_TABLET | Freq: Every day | ORAL | 3 refills | Status: DC

## 2023-09-16 NOTE — Progress Notes (Signed)
[facility] clinic  Provider:  Kenard Gower DNP  Code Status:  Full Code  Goals of Care:     09/16/2023   12:59 PM  Advanced Directives  Does Patient Have a Medical Advance Directive? No  Would patient like information on creating a medical advance directive? No - Patient declined     Chief Complaint  Patient presents with   Follow-up    6 month f/u and discuss BP   Immunizations    DTAP, Shingrix, Influenza, and Covid.    HPI: Patient is a 57 y.o. female seen today for a 7-month follow up of chronic issues. She has been having frequent headaches and Bernita Raisin does not seem to be effective. She would like to try new medication. BP 132/86. She stated that she used to be on Lisinopril but was eventually discontinued. She gained 10 lbs in 7 months. She stated that she doesn't exercise. She is currently on gluten free diet.   Type 2 diabetes mellitus with diabetic polyneuropathy, without long-term current use of insulin (HCC) - not on meds  Chronic bilateral low back pain with bilateral sciatica -  feels better since she does not work full time now, takes Neurontin  Hypercholesterolemia --  not on statin    Wt Readings from Last 3 Encounters:  09/16/23 234 lb (106.1 kg)  02/26/23 224 lb (101.6 kg)  02/01/23 227 lb 2 oz (103 kg)     Past Medical History:  Diagnosis Date   Bulging lumbar disc    H/O: hysterectomy 2010   Complete   Migraines     Past Surgical History:  Procedure Laterality Date   BACK SURGERY  2010   BUNIONECTOMY     COLONOSCOPY      Allergies  Allergen Reactions   Grass Pollen(K-O-R-T-Swt Vern) Other (See Comments)   Pollen Extract Other (See Comments)   Codeine Nausea And Vomiting    Outpatient Encounter Medications as of 09/16/2023  Medication Sig   ALPRAZolam (XANAX) 0.5 MG tablet Take 1 tablet (0.5 mg total) by mouth at bedtime as needed for anxiety.   COMBIGAN 0.2-0.5 % ophthalmic solution Apply 1 drop to eye 2 (two) times daily.    EPINEPHrine (EPIPEN 2-PAK) 0.3 mg/0.3 mL IJ SOAJ injection Inject 0.3 mg into the muscle as needed for anaphylaxis.   gabapentin (NEURONTIN) 300 MG capsule TAKE 1 CAPSULE BY MOUTH THREE TIMES A DAY   Multiple Vitamin (MULTIVITAMIN) tablet Take 1 tablet by mouth daily.   Rimegepant Sulfate (NURTEC) 75 MG TBDP Take 1 tablet (75 mg total) by mouth every other day.   [DISCONTINUED] lisinopril (ZESTRIL) 10 MG tablet Take 1 tablet (10 mg total) by mouth daily.   [DISCONTINUED] UBRELVY 100 MG TABS TAKE 1 TABLET (100 MG TOTAL) BY MOUTH DAILY AS NEEDED.   lisinopril (ZESTRIL) 10 MG tablet Take 1 tablet (10 mg total) by mouth daily.   [DISCONTINUED] lisinopril (ZESTRIL) 10 MG tablet Take 1 tablet (10 mg total) by mouth daily.   [DISCONTINUED] lisinopril (ZESTRIL) 10 MG tablet Take 1 tablet (10 mg total) by mouth daily.   [DISCONTINUED] lisinopril (ZESTRIL) 10 MG tablet Take 1 tablet (10 mg total) by mouth daily.   No facility-administered encounter medications on file as of 09/16/2023.    Review of Systems:  Review of Systems  Constitutional:  Negative for appetite change, chills, fatigue and fever.  HENT:  Negative for congestion, hearing loss, rhinorrhea and sore throat.   Eyes: Negative.   Respiratory:  Negative for cough,  shortness of breath and wheezing.   Cardiovascular:  Negative for chest pain, palpitations and leg swelling.  Gastrointestinal:  Negative for abdominal pain, constipation, diarrhea, nausea and vomiting.  Genitourinary:  Negative for dysuria.  Musculoskeletal:  Negative for arthralgias, back pain and myalgias.  Skin:  Negative for color change, rash and wound.  Neurological:  Positive for headaches. Negative for dizziness and weakness.  Psychiatric/Behavioral:  Negative for behavioral problems. The patient is not nervous/anxious.     Health Maintenance  Topic Date Due   FOOT EXAM  Never done   OPHTHALMOLOGY EXAM  Never done   Diabetic kidney evaluation - Urine ACR  Never  done   DTaP/Tdap/Td (1 - Tdap) Never done   Zoster Vaccines- Shingrix (1 of 2) Never done   HEMOGLOBIN A1C  05/29/2023   INFLUENZA VACCINE  Never done   COVID-19 Vaccine (4 - 2023-24 season) 08/15/2023   Hepatitis C Screening  09/15/2024 (Originally 03/24/1984)   HIV Screening  09/15/2024 (Originally [birth year]/1979)   Diabetic kidney evaluation - eGFR measurement  11/28/2023   MAMMOGRAM  05/26/2025   Colonoscopy  01/26/2028   HPV VACCINES  Aged Out    Physical Exam: Vitals:   09/16/23 1258  BP: 132/86  Pulse: 63  Resp: 17  Temp: 97.6 F (36.4 C)  TempSrc: Temporal  SpO2: 99%  Weight: 234 lb (106.1 kg)  Height: 5' 9.5" (1.765 m)   Body mass index is 34.06 kg/m. Physical Exam Constitutional:      General: She is not in acute distress.    Appearance: She is obese.  HENT:     Head: Normocephalic and atraumatic.     Nose: Nose normal.     Mouth/Throat:     Mouth: Mucous membranes are moist.  Eyes:     Conjunctiva/sclera: Conjunctivae normal.  Cardiovascular:     Rate and Rhythm: Normal rate and regular rhythm.  Pulmonary:     Effort: Pulmonary effort is normal.     Breath sounds: Normal breath sounds.  Abdominal:     General: Bowel sounds are normal.     Palpations: Abdomen is soft.  Musculoskeletal:        General: Normal range of motion.     Cervical back: Normal range of motion.  Skin:    General: Skin is warm and dry.  Neurological:     General: No focal deficit present.     Mental Status: She is alert and oriented to person, place, and time.  Psychiatric:        Mood and Affect: Mood normal.        Behavior: Behavior normal.        Thought Content: Thought content normal.        Judgment: Judgment normal.     Labs reviewed: Basic Metabolic Panel: Recent Labs    11/27/22 0942  NA 140  K 4.4  CL 107  CO2 26  GLUCOSE 136*  BUN 17  CREATININE 0.85  CALCIUM 9.6  TSH 1.00   Liver Function Tests: Recent Labs    11/27/22 0942  AST 18  ALT 15   BILITOT 0.7  PROT 7.0   No results for input(s): "LIPASE", "AMYLASE" in the last 8760 hours. No results for input(s): "AMMONIA" in the last 8760 hours. CBC: Recent Labs    11/27/22 0942  WBC 5.3  NEUTROABS 3,487  HGB 14.1  HCT 42.6  MCV 91.8  PLT 275   Lipid Panel: Recent Labs    11/27/22 0942  CHOL 215*  HDL 56  LDLCALC 137*  TRIG 108  CHOLHDL 3.8   Lab Results  Component Value Date   HGBA1C 6.6 (H) 11/27/2022    Procedures since last visit: No results found.  Assessment/Plan  1. Primary hypertension -  BP stable -  continue Lisinopril - Complete Metabolic Panel with eGFR - lisinopril (ZESTRIL) 10 MG tablet; Take 1 tablet (10 mg total) by mouth daily.  Dispense: 30 tablet; Refill: 3  2. Migraine with aura and without status migrainosus, not intractable -  discontinue Ubrelvy - CBC with Differential/Platelets - Complete Metabolic Panel with eGFR - Rimegepant Sulfate (NURTEC) 75 MG TBDP; Take 1 tablet (75 mg total) by mouth every other day.  Dispense: 45 tablet; Refill: 1  3. Type 2 diabetes mellitus with diabetic polyneuropathy, without long-term current use of insulin (HCC) -  diet-controlled - Microalbumin/Creatinine Ratio, Urine - Hemoglobin A1C  4. Chronic bilateral low back pain with bilateral sciatica -  stable -  continue Nerontin  5. Hypercholesterolemia Lab Results  Component Value Date   CHOL 215 (H) 11/27/2022   HDL 56 11/27/2022   LDLCALC 137 (H) 11/27/2022   TRIG 108 11/27/2022   CHOLHDL 3.8 11/27/2022    -  not on statin - Lipid panel  6. Class 1 obesity with body mass index (BMI) of 34.0 to 34.9 in adult, unspecified obesity type, unspecified whether serious comorbidity present -  counseled on diet and exercise  7. Flu vaccine refused -  declined flu vaccine    Labs/tests ordered:  lipid panel - Microalbumin/Creatinine Ratio, Urine - Hemoglobin A1C - CBC with Differential/Platelets - Complete Metabolic Panel with  eGFR   Next appt:  Visit date not found

## 2023-09-24 ENCOUNTER — Other Ambulatory Visit: Payer: Self-pay | Admitting: Adult Health

## 2023-09-24 ENCOUNTER — Telehealth: Payer: Managed Care, Other (non HMO) | Admitting: Adult Health

## 2023-09-24 DIAGNOSIS — G43109 Migraine with aura, not intractable, without status migrainosus: Secondary | ICD-10-CM

## 2023-09-24 MED ORDER — UBRELVY 100 MG PO TABS
1.0000 | ORAL_TABLET | Freq: Every day | ORAL | 1 refills | Status: DC | PRN
Start: 2023-09-24 — End: 2024-02-15

## 2023-09-24 NOTE — Telephone Encounter (Signed)
Patient called and stated that she is out of her medication and has bad migraines.  Requesting refill to be sent before weekend.   Rx was Pended and sent to [facility] for approval.

## 2023-09-24 NOTE — Telephone Encounter (Signed)
Medina-Vargas, Margit Banda, NP  Heath [place] A39 minutes ago (1:36 PM)    During the last visit she stated that Bernita Raisin is not working so we started her on Nurtec. I have now sent Rx for Ubrelvi.

## 2023-09-24 NOTE — Telephone Encounter (Signed)
Patient stated that she is taking medication for her Migraines.   Please Advise.

## 2023-09-24 NOTE — Telephone Encounter (Signed)
Patient called regarding her prescription for Ubrelvy. [facility] told her they could not fill the script until she was seen by her PCP; however, patient said she just saw [name] 2 weeks ago. Patient does not want to go into the weekend without her migraine medicine. Patient had spoken to Synetta Fail about this issue earlier today, and I have included Synetta Fail in this message.

## 2023-10-08 ENCOUNTER — Encounter: Payer: Self-pay | Admitting: Adult Health

## 2023-10-09 ENCOUNTER — Encounter: Payer: Self-pay | Admitting: Adult Health

## 2023-10-11 NOTE — Telephone Encounter (Signed)
Called and spoke with patient. She is not taking the Ubrelvy because it does not work. [name] called in Nurtec and patient stated that she has spoken with Judithann Sauger twice regarding Prior Authorization through insurance for the Nurtec.   I have sent previous message from Patient to [facility] to check the status of the prior Authorization. Awaiting response.

## 2023-10-11 NOTE — Telephone Encounter (Signed)
Forwarded to [facility] to see if a Prior Authorization has been initiated.  Please Advise.

## 2023-10-19 ENCOUNTER — Telehealth: Payer: Self-pay

## 2023-10-19 NOTE — Telephone Encounter (Addendum)
Message left on clinical intake voicemail:   Patient called to express that she has communicated with Judithann Sauger, CMA and Synetta Fail, CMA about a PA for her Nurtec and has yet to hear from anyone with a status update.  1.) I called customer service  number on the back of patients insurance card [phone].  2.) I was then instructed to call [phone], option 3, then option 1.  3.) I was then instructed by the representative that the only way to initiate a PA is through the website: [email or web address] 4.) Visited website as instructed and initiated Prior Authorization     5.) Waiting to hear from insurance company, Side Note:: This process took about 25 minutes from start to finish.

## 2023-10-19 NOTE — Telephone Encounter (Signed)
[employer] auth questionnaire received via fax to confirm information submitted through [email or web address] and requesting clinical notes. Last 2 ov notes printed and questionnaire reviewed, stamped with Medina-Vargas, Margit Banda, NP name and faxed to [phone].  Awaiting response

## 2023-10-21 ENCOUNTER — Telehealth: Payer: Managed Care, Other (non HMO)

## 2023-10-21 NOTE — Telephone Encounter (Signed)
Patient came in office because she was checking on the status for PA for Nurtec. I spoke with the prior authorization team and was told the PA is in review and no additional information is needed from our office at the moment.

## 2023-10-27 NOTE — Telephone Encounter (Signed)
RX was approved:    Outgoing call placed to [facility] informing them of approval (left detailed voicemail)

## 2023-10-28 ENCOUNTER — Telehealth: Payer: Self-pay

## 2023-10-28 NOTE — Telephone Encounter (Signed)
Called the patient to discuss medication approval

## 2024-02-14 ENCOUNTER — Other Ambulatory Visit: Payer: Self-pay | Admitting: Adult Health

## 2024-02-14 DIAGNOSIS — G43109 Migraine with aura, not intractable, without status migrainosus: Secondary | ICD-10-CM

## 2024-02-15 ENCOUNTER — Encounter: Payer: Self-pay | Admitting: Adult Health

## 2024-02-15 NOTE — Telephone Encounter (Signed)
 Patient has request refill on medication. Pend and sent to Abbey Chatters, NP due to PCP Medina-Vargas, Margit Banda, NP being out of office.

## 2024-02-21 NOTE — Telephone Encounter (Signed)
 Received fax from [place] was DENIED.  Denial sent to [facility]

## 2024-02-22 ENCOUNTER — Telehealth: Payer: Self-pay

## 2024-02-22 ENCOUNTER — Telehealth: Payer: Self-pay | Admitting: *Deleted

## 2024-02-22 NOTE — Telephone Encounter (Signed)
 Noted.

## 2024-02-22 NOTE — Telephone Encounter (Addendum)
 Copied from CRM [id number]. Topic: Clinical - Prescription Issue >> Feb 22, 2024  1:24 PM Suzette B wrote: Patient states she has been waiting on word in regards to the prior authorization for her medication in which she called about on yesterday, she stated that Rosann Auerbach told her that being she has been w/o medication they would instead call the office to notify them that it was a mistake that was made on the prior authorization.  Patient has requested call back to update her on the medication/prior authorization. [phone]  Spoke with patient this afternoon and she had stated that the prior authorization through CoverMyMeds was incorrect documentation.  I spoke with [place] to let them know that it was a mistake and that we redid the paperwork over the phone and it was approved through CoverMyMeds. The Case ID # [id number]. I called the patient and left a voicemail.  Message sent to Medina-Vargas, Margit Banda, NP

## 2024-02-22 NOTE — Telephone Encounter (Signed)
 Copied from CRM [id number]. Topic: Clinical - Request for Lab/Test Order >> Feb 22, 2024  2:54 PM Maxwell Marion wrote: Reason for CRM: Patient called to schedule 6 month follow appointment. Appointment has been scheduled but patient also wants to get labs. I see lab orders that are active from October of 2024, didn't know if new lab orders were needed or not. Please Advise  Message sent to Medina-Vargas, Margit Banda, NP

## 2024-02-22 NOTE — Telephone Encounter (Signed)
 Copied from CRM [id number]. Topic: Clinical - Prescription Issue >> Feb 21, 2024  3:22 PM Adrianna P wrote: Reason for CRM: Rosann Auerbach called, wants Dr to send prior authorization again for UBRELVY medication. >> Feb 21, 2024  3:32 PM Dondra Prader A wrote: Patient is calling back to let her PCP know that she just got off the phone with her insurance [employer], and they stated they would be calling the Dr. Isidore Moos due to the wrong things being checked on her Prior Authorization. [employer] was wanting to speak with someone at the office in order to get the patient their medication tomorrow.     Awaiting call from [facility]

## 2024-02-23 ENCOUNTER — Telehealth: Payer: Self-pay

## 2024-02-23 NOTE — Telephone Encounter (Signed)
 Incoming fax received from [place] to notify us that Destiny Fuentes was approved. I left a detailed message on the voicemail for patients pharmacy informing them of this approval.

## 2024-02-23 NOTE — Telephone Encounter (Signed)
 Labs can be obtained when she follows up.

## 2024-03-03 NOTE — Telephone Encounter (Signed)
 Pls confirm what else the patient needs.

## 2024-04-06 ENCOUNTER — Ambulatory Visit: Admitting: Adult Health

## 2024-04-08 LAB — CBC WITH DIFFERENTIAL/PLATELET
Absolute Lymphocytes: 1908 {cells}/uL (ref 850–3900)
Absolute Monocytes: 412 {cells}/uL (ref 200–950)
Basophils Absolute: 41 {cells}/uL (ref 0–200)
Basophils Relative: 0.7 %
Eosinophils Absolute: 319 {cells}/uL (ref 15–500)
Eosinophils Relative: 5.5 %
HCT: 40.7 % (ref 35.0–45.0)
Hemoglobin: 13.6 g/dL (ref 11.7–15.5)
MCH: 30.2 pg (ref 27.0–33.0)
MCHC: 33.4 g/dL (ref 32.0–36.0)
MCV: 90.2 fL (ref 80.0–100.0)
MPV: 10.3 fL (ref 7.5–12.5)
Monocytes Relative: 7.1 %
Neutro Abs: 3120 {cells}/uL (ref 1500–7800)
Neutrophils Relative %: 53.8 %
Platelets: 275 10*3/uL (ref 140–400)
RBC: 4.51 10*6/uL (ref 3.80–5.10)
RDW: 12.4 % (ref 11.0–15.0)
Total Lymphocyte: 32.9 %
WBC: 5.8 10*3/uL (ref 3.8–10.8)

## 2024-04-08 LAB — COMPREHENSIVE METABOLIC PANEL WITH GFR
AG Ratio: 1.6 (calc) (ref 1.0–2.5)
ALT: 12 U/L (ref 6–29)
AST: 17 U/L (ref 10–35)
Albumin: 4.1 g/dL (ref 3.6–5.1)
Alkaline phosphatase (APISO): 103 U/L (ref 37–153)
BUN: 12 mg/dL (ref 7–25)
CO2: 28 mmol/L (ref 20–32)
Calcium: 9.4 mg/dL (ref 8.6–10.4)
Chloride: 106 mmol/L (ref 98–110)
Creat: 0.83 mg/dL (ref 0.50–1.05)
Globulin: 2.6 g/dL (ref 1.9–3.7)
Glucose, Bld: 127 mg/dL — ABNORMAL HIGH (ref 65–99)
Potassium: 4.1 mmol/L (ref 3.5–5.3)
Sodium: 139 mmol/L (ref 135–146)
Total Bilirubin: 0.8 mg/dL (ref 0.2–1.2)
Total Protein: 6.7 g/dL (ref 6.1–8.1)
eGFR: 81 mL/min/{1.73_m2} (ref >=60)

## 2024-04-08 LAB — LIPID PANEL
Cholesterol: 231 mg/dL — ABNORMAL HIGH (ref <200)
HDL: 56 mg/dL (ref >=50)
LDL Cholesterol (Calc): 150 mg/dL — ABNORMAL HIGH
Non-HDL Cholesterol (Calc): 175 mg/dL — ABNORMAL HIGH (ref <130)
Total CHOL/HDL Ratio: 4.1 (calc) (ref <5.0)
Triglycerides: 126 mg/dL (ref <150)

## 2024-04-08 LAB — MICROALBUMIN / CREATININE URINE RATIO
Creatinine, Urine: 200 mg/dL (ref 20–275)
Microalb Creat Ratio: 2 mg/g{creat} (ref <30)
Microalb, Ur: 0.3 mg/dL

## 2024-04-08 LAB — HEMOGLOBIN A1C
Hgb A1c MFr Bld: 6.8 % — ABNORMAL HIGH (ref <5.7)
Mean Plasma Glucose: 148 mg/dL
eAG (mmol/L): 8.2 mmol/L

## 2024-04-10 ENCOUNTER — Ambulatory Visit: Admitting: Adult Health

## 2024-04-10 ENCOUNTER — Encounter: Payer: Self-pay | Admitting: Adult Health

## 2024-04-10 VITALS — BP 118/76 | HR 67 | Temp 96.9°F | Ht 69.5 in | Wt 229.2 lb

## 2024-04-10 DIAGNOSIS — B9689 Other specified bacterial agents as the cause of diseases classified elsewhere: Secondary | ICD-10-CM

## 2024-04-10 DIAGNOSIS — M5441 Lumbago with sciatica, right side: Secondary | ICD-10-CM

## 2024-04-10 DIAGNOSIS — G43909 Migraine, unspecified, not intractable, without status migrainosus: Secondary | ICD-10-CM | POA: Insufficient documentation

## 2024-04-10 DIAGNOSIS — G43109 Migraine with aura, not intractable, without status migrainosus: Secondary | ICD-10-CM

## 2024-04-10 DIAGNOSIS — H40053 Ocular hypertension, bilateral: Secondary | ICD-10-CM

## 2024-04-10 DIAGNOSIS — I10 Essential (primary) hypertension: Secondary | ICD-10-CM | POA: Diagnosis not present

## 2024-04-10 DIAGNOSIS — M5442 Lumbago with sciatica, left side: Secondary | ICD-10-CM | POA: Diagnosis not present

## 2024-04-10 DIAGNOSIS — E782 Mixed hyperlipidemia: Secondary | ICD-10-CM

## 2024-04-10 DIAGNOSIS — G8929 Other chronic pain: Secondary | ICD-10-CM

## 2024-04-10 DIAGNOSIS — J019 Acute sinusitis, unspecified: Secondary | ICD-10-CM

## 2024-04-10 DIAGNOSIS — E1142 Type 2 diabetes mellitus with diabetic polyneuropathy: Secondary | ICD-10-CM

## 2024-04-10 MED ORDER — AMOXICILLIN-POT CLAVULANATE 875-125 MG PO TABS
1.0000 | ORAL_TABLET | Freq: Two times a day (BID) | ORAL | 0 refills | Status: AC
Start: 2024-04-10 — End: 2024-04-17

## 2024-04-10 MED ORDER — UBRELVY 100 MG PO TABS: 1.0000 | ORAL_TABLET | Freq: Every day | ORAL | 2 refills | Status: DC | PRN

## 2024-04-10 NOTE — Progress Notes (Signed)
Discussed with patient during the visit

## 2024-04-10 NOTE — Progress Notes (Signed)
 [facility] clinic  Provider:  Inge Mangle DNP  Code Status:  Full code  Goals of Care:     09/16/2023   12:59 PM  Advanced Directives  Does Patient Have a Medical Advance Directive? No  Would patient like information on creating a medical advance directive? No - Patient declined     Chief Complaint  Patient presents with   Medical Management of Chronic Issues    6 month follow up. In addition needs to discuss Covid, DTAP, Pneumococcal, and Shingrix vaccine   pt  thinks he has sinus infection , having sinus pressure , and congestion     Discussed the use of AI scribe software for clinical note transcription with the patient, who gave verbal consent to proceed.  HPI: Patient is a 58 y.o. female seen today for a 12-month follow up of chronic medical issues. . She has been experiencing sinus pressure bilaterally for the past two weeks, accompanied by a constant yellow discharge and occasional epistaxis. She has a history of sinus issues related to pollen and typically finds relief with Augmentin. She has been using a NetiPak, which initially provided relief, but symptoms have worsened in the last few days.  She has been experiencing severe migraines for the past two weeks, which she attributes to sinus pressure. Migraines occur every other day, with a particularly bad episode last week that prevented her from sleeping. She has tried Nurtec without significant relief.  She has chronic bilateral low back pain with bilateral sciatica, which has been severe in the past few months, with pain radiating down her legs. She rates her current pain as a 3 out of 10, though it was an 8 a few days ago. She takes gabapentin , 300 mg,TID.Destiny Fuentes  Her blood pressure is well-controlled with lisinopril  10 mg, though she does not take it regularly. She has a history of hyperlipidemia, with recent lab results showing an increase in cholesterol and LDL levels. She has a history of diabetes with an A1c of 6.8,  which has been stable for the past 12-13 years. She is not currently on medication for diabetes and prefers to manage it through lifestyle changes. She currently takes Ubrelvy  PRN for migraine.  She reports a history of increased intraocular pressure, for which she uses Combigan eye drops. Her family history includes diabetes. She works three days a week, sometimes ten-hour shifts, and is not yet eligible for Medicare. She pays $146 a week for health and dental insurance.    Past Medical History:  Diagnosis Date   Bulging lumbar disc    H/O: hysterectomy 2010   Complete   Migraines     Past Surgical History:  Procedure Laterality Date   BACK SURGERY  2010   BUNIONECTOMY     COLONOSCOPY      Allergies  Allergen Reactions   Grass Pollen(K-O-R-T-Swt Vern) Other (See Comments)   Pollen Extract Other (See Comments)   Codeine Nausea And Vomiting    Outpatient Encounter Medications as of 04/10/2024  Medication Sig   amoxicillin-clavulanate (AUGMENTIN) 875-125 MG tablet Take 1 tablet by mouth 2 (two) times daily for 7 days.   COMBIGAN 0.2-0.5 % ophthalmic solution Apply 1 drop to eye 2 (two) times daily.   EPINEPHrine  (EPIPEN  2-PAK) 0.3 mg/0.3 mL IJ SOAJ injection Inject 0.3 mg into the muscle as needed for anaphylaxis.   gabapentin  (NEURONTIN ) 300 MG capsule TAKE 1 CAPSULE BY MOUTH THREE TIMES A DAY   lisinopril  (ZESTRIL ) 10 MG tablet Take 1 tablet (  10 mg total) by mouth daily.   Multiple Vitamin (MULTIVITAMIN) tablet Take 1 tablet by mouth daily.   [DISCONTINUED] UBRELVY  100 MG TABS TAKE 1 TABLET (100 MG TOTAL) BY MOUTH DAILY AS NEEDED.   Ubrogepant  (UBRELVY ) 100 MG TABS Take 1 tablet (100 mg total) by mouth daily as needed.   [DISCONTINUED] Rimegepant Sulfate (NURTEC) 75 MG TBDP Take 1 tablet (75 mg total) by mouth every other day.   No facility-administered encounter medications on file as of 04/10/2024.    Review of Systems:  Review of Systems  Constitutional:  Negative for  appetite change, chills, fatigue and fever.  HENT:  Positive for postnasal drip, sinus pressure, sinus pain, sneezing and sore throat. Negative for congestion, hearing loss and rhinorrhea.   Eyes: Negative.   Respiratory:  Negative for cough, shortness of breath and wheezing.   Cardiovascular:  Negative for chest pain, palpitations and leg swelling.  Gastrointestinal:  Negative for abdominal pain, constipation, diarrhea, nausea and vomiting.  Genitourinary:  Negative for dysuria.  Musculoskeletal:  Negative for arthralgias, back pain and myalgias.  Skin:  Negative for color change, rash and wound.  Neurological:  Negative for dizziness, weakness and headaches.  Psychiatric/Behavioral:  Negative for behavioral problems. The patient is not nervous/anxious.     Health Maintenance  Topic Date Due   DTaP/Tdap/Td (1 - Tdap) Never done   Pneumococcal Vaccine 15-39 Years old (1 of 2 - PCV) Never done   Zoster Vaccines- Shingrix (1 of 2) Never done   COVID-19 Vaccine (4 - 2024-25 season) 08/15/2023   Hepatitis C Screening  09/15/2024 (Originally 03/24/1984)   HIV Screening  09/15/2024 (Originally 03/25/1981)   OPHTHALMOLOGY EXAM  05/17/2024   INFLUENZA VACCINE  07/14/2024   FOOT EXAM  09/15/2024   HEMOGLOBIN A1C  10/07/2024   Diabetic kidney evaluation - eGFR measurement  04/07/2025   Diabetic kidney evaluation - Urine ACR  04/07/2025   MAMMOGRAM  05/26/2025   Colonoscopy  01/26/2028   HPV VACCINES  Aged Out   Meningococcal B Vaccine  Aged Out    Physical Exam: Vitals:   04/10/24 0838  BP: 118/76  Pulse: 67  Temp: (!) 96.9 F (36.1 C)  TempSrc: Temporal  SpO2: 97%  Weight: 229 lb 3.2 oz (104 kg)  Height: 5' 9.5" (1.765 m)   Body mass index is 33.36 kg/m. Physical Exam Constitutional:      Appearance: She is obese.  HENT:     Head: Normocephalic and atraumatic.     Nose: Nose normal.     Mouth/Throat:     Mouth: Mucous membranes are moist.  Eyes:     Conjunctiva/sclera:  Conjunctivae normal.  Cardiovascular:     Rate and Rhythm: Normal rate and regular rhythm.  Pulmonary:     Effort: Pulmonary effort is normal.     Breath sounds: Normal breath sounds.  Abdominal:     General: Bowel sounds are normal.     Palpations: Abdomen is soft.  Musculoskeletal:        General: Normal range of motion.     Cervical back: Normal range of motion.  Skin:    General: Skin is warm and dry.  Neurological:     General: No focal deficit present.     Mental Status: She is alert and oriented to person, place, and time.  Psychiatric:        Mood and Affect: Mood normal.        Behavior: Behavior normal.  Thought Content: Thought content normal.        Judgment: Judgment normal.     Labs reviewed: Basic Metabolic Panel: Recent Labs    04/07/24 0828  NA 139  K 4.1  CL 106  CO2 28  GLUCOSE 127*  BUN 12  CREATININE 0.83  CALCIUM 9.4   Liver Function Tests: Recent Labs    04/07/24 0828  AST 17  ALT 12  BILITOT 0.8  PROT 6.7   No results for input(s): "LIPASE", "AMYLASE" in the last 8760 hours. No results for input(s): "AMMONIA" in the last 8760 hours. CBC: Recent Labs    04/07/24 0828  WBC 5.8  NEUTROABS 3,120  HGB 13.6  HCT 40.7  MCV 90.2  PLT 275   Lipid Panel: Recent Labs    04/07/24 0828  CHOL 231*  HDL 56  LDLCALC 150*  TRIG 126  CHOLHDL 4.1   Lab Results  Component Value Date   HGBA1C 6.8 (H) 04/07/2024    Procedures since last visit: No results found.  Assessment/Plan  1. Acute bacterial rhinosinusitis (Primary) -  Sinusitis with nasal pain for 2 weeks, symptoms include yellowish discharge, nasal pressure, and occasional epistaxis. Augmentin effective previously. - Prescribe Augmentin for 7 days. - Follow up if symptoms persist post-antibiotics. - amoxicillin-clavulanate (AUGMENTIN) 875-125 MG tablet; Take 1 tablet by mouth 2 (two) times daily for 7 days.  Dispense: 14 tablet; Refill: 0  2. Migraine with aura and  without status migrainosus, not intractable -  Migraines every other day for two weeks, current medications ineffective. Interested in neurologist consultation for Botox treatment. - Refer to neurologist for evaluation and potential Botox treatment. - continue Ubrelvy  for acute migraine management. - Ambulatory referral to Neurology - Ubrogepant  (UBRELVY ) 100 MG TABS; Take 1 tablet (100 mg total) by mouth daily as needed.  Dispense: 10 tablet; Refill: 2  3. Primary hypertension -  Blood pressure well-controlled at 118/76 mmHg. Not consistently taking lisinopril  10 mg but reports good control. Discussed medication adherence. - Encourage adherence to lisinopril  10 mg daily.  4. Chronic bilateral low back pain with bilateral sciatica -  Chronic low back pain with sciatica, pain level varies from 3 to 8 out of 10. Managed with gabapentin  300 mg TID - Continue gabapentin  300 mg TID for pain management.  5. Mixed hyperlipidemia -  Cholesterol increased from 215 mg/dL to 562 mg/dL, LDL from 130 mg/dL to 865 mg/dL. Prefers lifestyle modifications before medication. - Encourage strict diet and exercise regimen to lower cholesterol. - Reassess lipid panel in 3 months.  - Lipid panel; Future  6. Type 2 diabetes mellitus with diabetic polyneuropathy, without long-term current use of insulin (HCC) -  Hemoglobin A1c increased from 6.6% to 6.8%. Prefers lifestyle modifications over medication. Discussed diet and exercise importance. - Encourage strict diet and exercise regimen to lower A1c. - Reassess A1c in 6 months. - Hemoglobin A1C; Future  7. Raised intraocular pressure of both eyes -  denies pain nor change in vision -  continue Combigan ophthalmic solution -  follows up with ophthalmology      Labs/tests ordered:   A1C and lipid panel in 6 months   Return in about 6 months (around 10/10/2024).  Mulki Destiny Medina-Vargas, NP

## 2024-04-18 ENCOUNTER — Encounter: Payer: Self-pay | Admitting: Adult Health

## 2024-04-18 DIAGNOSIS — E1142 Type 2 diabetes mellitus with diabetic polyneuropathy: Secondary | ICD-10-CM

## 2024-04-18 DIAGNOSIS — G8929 Other chronic pain: Secondary | ICD-10-CM

## 2024-04-19 MED ORDER — GABAPENTIN 300 MG PO CAPS: 300.0000 mg | ORAL_CAPSULE | Freq: Three times a day (TID) | ORAL | 1 refills | Status: AC

## 2024-04-19 MED ORDER — ACCU-CHEK AVIVA PLUS VI STRP: 1.0000 | ORAL_STRIP | Freq: Every day | 12 refills | Status: DC

## 2024-04-19 MED ORDER — ACCU-CHEK AVIVA PLUS W/DEVICE KIT: 1.0000 | PACK | Freq: Every day | 12 refills | Status: AC

## 2024-04-19 MED ORDER — ACCU-CHEK SOFTCLIX LANCETS MISC: 1.0000 | Freq: Every day | 12 refills | Status: AC

## 2024-04-19 NOTE — Telephone Encounter (Signed)
 [name] please advise if in agreement with patients request and if so what is the frequency of testing her blood sugar  RX's pending

## 2024-04-20 ENCOUNTER — Other Ambulatory Visit: Payer: Self-pay | Admitting: Adult Health

## 2024-04-20 DIAGNOSIS — E1142 Type 2 diabetes mellitus with diabetic polyneuropathy: Secondary | ICD-10-CM

## 2024-05-03 ENCOUNTER — Encounter: Payer: Self-pay | Admitting: Adult Health

## 2024-05-05 ENCOUNTER — Ambulatory Visit: Payer: Self-pay

## 2024-05-05 NOTE — Telephone Encounter (Signed)
 See Triage Notes from Triage Nurse FYI.  Message sent to Medina-Vargas, Margit Banda, NP

## 2024-05-05 NOTE — Telephone Encounter (Signed)
 Copied from CRM [id number]. Topic: Clinical - Medication Question >> May 05, 2024  3:03 PM Lenon Radar A wrote: Reason for CRM: Patient called in to see if doctor can send in Nurtec medication to pharmacy for her as new prescription due to having excessive migraines. Patient is asking for medication to be sent to [id number] - [place],  - [address] AT [address] OF [address]. Patient stated she wants to try the medication again. Please contact patient with any additional questions at [phone].  First attempt; left vm.

## 2024-05-05 NOTE — Telephone Encounter (Signed)
  Chief Complaint: Migraines Symptoms: nausea, dizziness Frequency: Ongoing, intermittent Pertinent Negatives: Patient denies vision changes Disposition: [] ED /[] Urgent Care (no appt availability in office) / [] Appointment(In office/virtual)/ []  [facility]/ [] Home Care/ [] Refused Recommended Disposition /[]  Mobile Bus/ []  Follow-up with PCP Additional Notes: Pt notes she continues with daily migraine episodes, notes they come and go but are almost daily. Pt reports the Ubrelvy  is not effective and is requesting Nurtec be sent to pharmacy so she can try that again. She would also like to know if there is another neuro group she can be referred to as the one she is scheduled with is scheduling out to September for NP. This RN educated pt on home care, new-worsening symptoms, when to call back/seek emergent care. Pt verbalized understanding and agrees to plan.    Reason for Disposition  [1] Caller has URGENT medicine question about med that PCP or specialist prescribed AND [2] triager unable to answer question  Answer Assessment - Initial Assessment Questions 1. NAME of MEDICINE: "What medicine(s) are you calling about?"     Nurtec 2. QUESTION: "What is your question?" (e.g., double dose of medicine, side effect)     Pt would like to try this again for migraines 3. PRESCRIBER: "Who prescribed the medicine?" Reason: if prescribed by specialist, call should be referred to that group.     PCP 4. SYMPTOMS: "Do you have any symptoms?" If Yes, ask: "What symptoms are you having?"  "How bad are the symptoms (e.g., mild, moderate, severe)     Ongoing migraine  Protocols used: Medication Question Call-A-AH

## 2024-05-07 ENCOUNTER — Other Ambulatory Visit: Payer: Self-pay | Admitting: Adult Health

## 2024-05-07 DIAGNOSIS — G43109 Migraine with aura, not intractable, without status migrainosus: Secondary | ICD-10-CM

## 2024-05-07 MED ORDER — RIMEGEPANT SULFATE 75 MG PO TBDP: 75.0000 mg | ORAL_TABLET | ORAL | 1 refills | Status: DC

## 2024-05-07 NOTE — Telephone Encounter (Signed)
 Nurtec eRX sent to pharmacy.

## 2024-05-09 NOTE — Telephone Encounter (Signed)
 Sent message to referral person, Fernand Howard. FYI.

## 2024-05-09 NOTE — Telephone Encounter (Signed)
 Patient can call [facility] daily and ask for any cancellations but she is on the list to be called for any cancellations. Referral person will ask as well.

## 2024-05-09 NOTE — Telephone Encounter (Signed)
 Noted

## 2024-05-17 ENCOUNTER — Telehealth: Payer: Self-pay

## 2024-05-17 ENCOUNTER — Ambulatory Visit: Admitting: Diagnostic Neuroimaging

## 2024-05-17 ENCOUNTER — Encounter: Payer: Self-pay | Admitting: Diagnostic Neuroimaging

## 2024-05-17 ENCOUNTER — Other Ambulatory Visit (HOSPITAL_COMMUNITY): Payer: Self-pay

## 2024-05-17 VITALS — BP 118/95 | HR 56 | Ht 69.5 in | Wt 222.0 lb

## 2024-05-17 DIAGNOSIS — G43709 Chronic migraine without aura, not intractable, without status migrainosus: Secondary | ICD-10-CM

## 2024-05-17 DIAGNOSIS — G4719 Other hypersomnia: Secondary | ICD-10-CM

## 2024-05-17 DIAGNOSIS — G43109 Migraine with aura, not intractable, without status migrainosus: Secondary | ICD-10-CM | POA: Diagnosis not present

## 2024-05-17 MED ORDER — RIMEGEPANT SULFATE 75 MG PO TBDP
75.0000 mg | ORAL_TABLET | Freq: Every day | ORAL | 6 refills | Status: DC | PRN
Start: 2024-05-17 — End: 2024-10-11

## 2024-05-17 MED ORDER — EMGALITY 120 MG/ML ~~LOC~~ SOAJ: SUBCUTANEOUS | 0 refills | Status: DC

## 2024-05-17 NOTE — Telephone Encounter (Signed)
 Pharmacy Patient Advocate Encounter  Received notification from [employer] that Prior Authorization for Emgality 120MG /ML auto-injectors (migraine) has been APPROVED from 05/17/2024 to 05/17/2025. Ran test claim, Copay is $30.00. This test claim was processed through [facility]- copay amounts may vary at other pharmacies due to pharmacy/plan contracts, or as the patient moves through the different stages of their insurance plan.   PA #/Case ID/Reference #: [id number]

## 2024-05-17 NOTE — Progress Notes (Signed)
 [facility]  PATIENT: Destiny Fuentes DOB: [birth year]  REFERRING CLINICIAN: Medina-Vargas, [name] C* HISTORY FROM: patient  REASON FOR VISIT: new consult   HISTORICAL  CHIEF COMPLAINT:  Chief Complaint  Patient presents with   New Patient (Initial Visit)    Pt alone, rm 6. She states that around 48 yrs she startes having migraines. At that time she was having 3-4 a month. Here recently she is having daily headaches and most turn into migraines. Curently on nurtec every other day as abortive theapy and it helps dull it down but doesn't take it away. She takes Excedrin or tylenol but doesn't help.    Other    Medication- tried and failed preventatives- topamax, amitrtipyline, gabapentin  and nurtec. Abortive therapy- tylenol, rizatriptan , sumatriptan, ubrelvy .  Has not tried any of the once a month injections or Qulipta. Was interested in botox    HISTORY OF PRESENT ILLNESS:   58 year old female here for evaluation of migraine headaches.  History of headaches since age 26 years old with left-sided burning scalp sensitivity, throbbing headaches, nasal congestion, nausea, sensitivity to light, lasting hours at a time.  Weather changes may be a triggering factor.  Also strong family history of migraine headaches.  No visual aura or other warning before headaches start.  Headaches were occurring 3-4 times a month, now more than 25 days/month.  Has tried variety of preventative medications including topiramate, amitriptyline, gabapentin  and Nurtec every other day without relief.  Also has tried rizatriptan , sumatriptan, Ubrelvy , Tylenol, Excedrin Migraine over-the-counter without relief.    Has some interrupted sleep due to migraines.  Sometimes wakes up with headaches.  Some daytime fatigue.  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   REVIEW OF SYSTEMS:  Full 14 system review of systems performed and negative with exception of: As per HPI.  ALLERGIES: Allergies  Allergen Reactions   Grass Pollen(K-O-R-T-Swt Vern) Other (See Comments)   Pollen Extract Other (See Comments)   Codeine Nausea And Vomiting    HOME MEDICATIONS: Outpatient Medications Prior to Visit  Medication Sig Dispense Refill   Accu-Chek Softclix Lancets lancets 1 each by Other route daily. E11.42 100 each 12   acetaminophen (TYLENOL) 500 MG tablet Take 500 mg by mouth every 8 (eight) hours as needed for moderate pain (pain score 4-6).     aspirin-acetaminophen-caffeine (EXCEDRIN MIGRAINE) 250-250-65 MG tablet Take 1-2 tablets by mouth every 6 (six) hours as needed for headache.     Blood Glucose Monitoring Suppl (ACCU-CHEK AVIVA PLUS) w/Device KIT 1 Device by Does not apply route daily. E11.42 1 kit 12   COMBIGAN 0.2-0.5 % ophthalmic solution Apply 1 drop to eye 2 (two) times daily.     EPINEPHrine  (EPIPEN  2-PAK) 0.3 mg/0.3 mL IJ SOAJ injection Inject 0.3 mg into the muscle as needed for anaphylaxis. 1 each 3   gabapentin  (NEURONTIN ) 300 MG capsule Take 1 capsule (300 mg total) by mouth 3 (three) times daily. 270 capsule 1   glucose blood (FREESTYLE TEST STRIPS) test strip Use one strip  to check blood sugar three a  day  dx E11.9 100 strip 0   latanoprost (XALATAN) 0.005 % ophthalmic solution Place 1 drop into both eyes at bedtime.     lisinopril  (ZESTRIL ) 10 MG tablet Take 1 tablet (10 mg total) by mouth daily. 30 tablet 3   Multiple Vitamin (MULTIVITAMIN) tablet  Take 1 tablet by mouth daily.     Rimegepant Sulfate  75 MG TBDP Take 1 tablet (75 mg total) by mouth every other day. 45 tablet 1   Ubrogepant  (UBRELVY ) 100 MG TABS Take 1 tablet (100 mg total) by mouth daily as needed. 10 tablet 2   No facility-administered medications prior to visit.    PAST MEDICAL HISTORY: Past Medical History:  Diagnosis Date   Bulging lumbar disc    H/O: hysterectomy 2010   Complete    Migraines     PAST SURGICAL HISTORY: Past Surgical History:  Procedure Laterality Date   BACK SURGERY  2010   BUNIONECTOMY     COLONOSCOPY      FAMILY HISTORY: Family History  Problem Relation Age of Onset   Glaucoma Mother    Hypertension Mother    Glaucoma Father    Rectal cancer Sister        Age 11   Blindness Brother    High blood pressure Brother    Diabetes Brother    Colon cancer Neg Hx    Colon polyps Neg Hx    Esophageal cancer Neg Hx    Stomach cancer Neg Hx     SOCIAL HISTORY: Social History   Socioeconomic History   Marital status: Single    Spouse name: Not on file   Number of children: Not on file   Years of education: Not on file   Highest education level: Not on file  Occupational History   Not on file  Tobacco Use   Smoking status: Never   Smokeless tobacco: Never  Vaping Use   Vaping status: Never Used  Substance and Sexual Activity   Alcohol use: Not Currently    Comment: Occasionally   Drug use: Never   Sexual activity: Not on file  Other Topics Concern   Not on file  Social History Narrative   Not on file   Social Drivers of Health   Financial Resource Strain: Not on file  Food Insecurity: Not on file  Transportation Needs: Not on file  Physical Activity: Not on file  Stress: Not on file  Social Connections: Not on file  Intimate Partner Violence: Not on file     PHYSICAL EXAM  GENERAL EXAM/CONSTITUTIONAL: Vitals:  Vitals:   05/17/24 1059  BP: (!) 118/95  Pulse: (!) 56  Weight: 222 lb (100.7 kg)  Height: 5' 9.5" (1.765 m)   Body mass index is 32.31 kg/m. Wt Readings from Last 3 Encounters:  05/17/24 222 lb (100.7 kg)  04/10/24 229 lb 3.2 oz (104 kg)  09/16/23 234 lb (106.1 kg)   Patient is in no distress; well developed, nourished and groomed; neck is supple  CARDIOVASCULAR: Examination of carotid arteries is normal; no carotid bruits Regular rate and rhythm, no murmurs Examination of peripheral vascular  system by observation and palpation is normal  EYES: Ophthalmoscopic exam of optic discs and posterior segments is normal; no papilledema or hemorrhages No results found.  MUSCULOSKELETAL: Gait, strength, tone, movements noted in Neurologic exam below  NEUROLOGIC: MENTAL STATUS:      No data to display         awake, alert, oriented to person, place and time recent and remote memory intact normal attention and concentration language fluent, comprehension intact, naming intact fund of knowledge appropriate  CRANIAL NERVE:  2nd - no papilledema on fundoscopic exam 2nd, 3rd, 4th, 6th - pupils equal and reactive to light, visual fields full to confrontation, extraocular muscles  intact, no nystagmus 5th - facial sensation symmetric 7th - facial strength symmetric 8th - hearing intact 9th - palate elevates symmetrically, uvula midline 11th - shoulder shrug symmetric 12th - tongue protrusion midline  MOTOR:  normal bulk and tone, full strength in the BUE, BLE  SENSORY:  normal and symmetric to light touch, temperature, vibration  COORDINATION:  finger-nose-finger, fine finger movements normal  REFLEXES:  deep tendon reflexes present and symmetric  GAIT/STATION:  narrow based gait     DIAGNOSTIC DATA (LABS, IMAGING, TESTING) - I reviewed patient records, labs, notes, testing and imaging myself where available.  Lab Results  Component Value Date   WBC 5.8 04/07/2024   HGB 13.6 04/07/2024   HCT 40.7 04/07/2024   MCV 90.2 04/07/2024   PLT 275 04/07/2024      Component Value Date/Time   NA 139 04/07/2024 0828   K 4.1 04/07/2024 0828   CL 106 04/07/2024 0828   CO2 28 04/07/2024 0828   GLUCOSE 127 (H) 04/07/2024 0828   BUN 12 04/07/2024 0828   CREATININE 0.83 04/07/2024 0828   CALCIUM 9.4 04/07/2024 0828   PROT 6.7 04/07/2024 0828   AST 17 04/07/2024 0828   ALT 12 04/07/2024 0828   BILITOT 0.8 04/07/2024 0828   Lab Results  Component Value Date   CHOL  231 (H) 04/07/2024   HDL 56 04/07/2024   LDLCALC 150 (H) 04/07/2024   TRIG 126 04/07/2024   CHOLHDL 4.1 04/07/2024   Lab Results  Component Value Date   HGBA1C 6.8 (H) 04/07/2024   No results found for: "VITAMINB12" Lab Results  Component Value Date   TSH 1.00 11/27/2022    10/15/20 MRI brain 1.  Findings most suggestive of chronic small vessel ischemic disease.  2.  Within the confines of this noncontrast examination, no gross signal abnormality or mass like lesion in the internal auditory canals.    ASSESSMENT AND PLAN  58 y.o. year old female here with history of migraine without aura since age 33 years old, with persistent and increasing headaches:  Meds tried: preventative: topamax, amitrtipyline, gabapentin  and nurtec. Abortive therapy: tylenol, rizatriptan , sumatriptan, ubrelvy .     Dx:  1. Chronic migraine without aura without status migrainosus, not intractable   2. Migraine with aura and without status migrainosus, not intractable   3. Excessive daytime sleepiness      PLAN:  MIGRAINE WITHOUT AURA TREATMENT PLAN:  MIGRAINE PREVENTION (> 25+ days per month) LIFESTYLE CHANGES -Stop or avoid smoking -Decrease or avoid caffeine / alcohol -Eat and sleep on a regular schedule -Exercise several times per week - start galazanezumab (Emgality) 240mg  loading dose; then 120mg  monthly - consider botox for migraine  MIGRAINE RESCUE  - ibuprofen, tylenol as needed - rimegepant (Nurtec) 75mg  as needed for breakthrough headache; max 8 per month  DAYTIME FATIGUE EARLY MORNING HEADACHES, INSOMNIA - check sleep study; rule out OSA   Orders Placed This Encounter  Procedures   Ambulatory referral to Sleep Studies   Meds ordered this encounter  Medications   Rimegepant Sulfate  75 MG TBDP    Sig: Take 1 tablet (75 mg total) by mouth daily as needed.    Dispense:  8 tablet    Refill:  6   Galcanezumab-gnlm (EMGALITY) 120 MG/ML SOAJ    Sig: Start 240mg  injection  loading dose x 1; after 1 month start 120mg  monthly injections    Dispense:  2 mL    Refill:  0   Return in about 6  months (around 11/16/2024) for with NP, MyChart visit (15 min).    Omega Bible, MD 05/17/2024, 11:40 AM Certified in Neurology, Neurophysiology and Neuroimaging  [facility] [address] [place], [place] [address] [phone]

## 2024-05-17 NOTE — Patient Instructions (Signed)
 MIGRAINE TREATMENT PLAN:  MIGRAINE PREVENTION (> 25+ days per month) LIFESTYLE CHANGES -Stop or avoid smoking -Decrease or avoid caffeine / alcohol -Eat and sleep on a regular schedule -Exercise several times per week - start galazanezumab (Emgality) 240mg  loading dose; then 120mg  monthly - consider botox for migraine  MIGRAINE RESCUE  - ibuprofen, tylenol as needed - rimegepant (Nurtec) 75mg  as needed for breakthrough headache; max 8 per month  DAYTIME FATIGUE EARLY MORNING HEADACHES, INSOMNIA - check sleep study; rule out OSA

## 2024-05-17 NOTE — Telephone Encounter (Signed)
 Pharmacy Patient Advocate Encounter   Received notification from CoverMyMeds that prior authorization for Emgality 120MG /ML auto-injectors (migraine) is required/requested.   Insurance verification completed.   The patient is insured through [employer] .   Per test claim: PA required; PA submitted to above mentioned insurance via CoverMyMeds Key/confirmation #/EOC [id number] Status is pending

## 2024-05-24 ENCOUNTER — Other Ambulatory Visit: Payer: Self-pay | Admitting: Adult Health

## 2024-05-24 DIAGNOSIS — E1142 Type 2 diabetes mellitus with diabetic polyneuropathy: Secondary | ICD-10-CM

## 2024-05-24 NOTE — Telephone Encounter (Signed)
 Left a voicemail for the patient to give us  a call back. We need to know whether she is actively using the Accu-Check meter or the Freestyle meter to monitor her glucose. We received an Rx refill request for the Accu-Check test strips and need to verify before we can approve.

## 2024-05-24 NOTE — Telephone Encounter (Signed)
 Attn: Lynnette Saucer, CMA    Pt Lees returned your call and stated please disregard the Rx refill request for the Accu-Check test strips.  She still has some and does not use them that often. She stated she will call when she does need some.   Thank you

## 2024-06-07 ENCOUNTER — Other Ambulatory Visit: Payer: Self-pay | Admitting: Adult Health

## 2024-06-07 DIAGNOSIS — Z1231 Encounter for screening mammogram for malignant neoplasm of breast: Secondary | ICD-10-CM

## 2024-06-13 ENCOUNTER — Encounter: Payer: Self-pay | Admitting: Diagnostic Neuroimaging

## 2024-06-19 ENCOUNTER — Encounter: Payer: Self-pay | Admitting: Adult Health

## 2024-06-19 ENCOUNTER — Other Ambulatory Visit: Payer: Self-pay | Admitting: Diagnostic Neuroimaging

## 2024-06-20 ENCOUNTER — Other Ambulatory Visit: Payer: Self-pay

## 2024-06-20 ENCOUNTER — Other Ambulatory Visit: Payer: Self-pay | Admitting: Diagnostic Neuroimaging

## 2024-06-20 MED ORDER — EMGALITY 120 MG/ML ~~LOC~~ SOAJ: 120.0000 mg | SUBCUTANEOUS | 5 refills | Status: DC

## 2024-06-20 NOTE — Telephone Encounter (Signed)
 Pt called wanting to know when her Galcanezumab -gnlm (EMGALITY ) 120 MG/ML SOAJ will be filled. Pt states she is over due for it and there were no refills put in for her. Please advise.

## 2024-06-23 ENCOUNTER — Encounter: Payer: Self-pay | Admitting: Diagnostic Neuroimaging

## 2024-06-26 ENCOUNTER — Encounter: Payer: Self-pay | Admitting: Adult Health

## 2024-06-26 NOTE — Progress Notes (Signed)
 Letter for jury duty excuse completed.

## 2024-06-26 NOTE — Telephone Encounter (Signed)
 Patient called upset that her message had not been responded to and emphasized  It was sent a week ago please advise and respond directly to the patient

## 2024-06-26 NOTE — Telephone Encounter (Signed)
 Letter for jury duty excuse written and handed to front desk, Ozell, who will call patient.

## 2024-06-26 NOTE — Telephone Encounter (Signed)
 Patient called in wanting refill sent in for Emgality  states she has been without it for a month and needs this done asap.

## 2024-06-27 ENCOUNTER — Telehealth: Payer: Self-pay | Admitting: Neurology

## 2024-06-27 MED ORDER — EMGALITY 120 MG/ML ~~LOC~~ SOAJ: 120.0000 mg | SUBCUTANEOUS | 5 refills | Status: DC

## 2024-06-27 NOTE — Telephone Encounter (Signed)
 PA submitted through [facility] for Emgality  XzB:[id number] States active PA is on file  active PA is already on file with expiration date of 05/17/2025

## 2024-06-27 NOTE — Addendum Note (Signed)
 Addended by: DELFINO AUGUSTIN BROCKS on: 06/27/2024 02:40 PM   Modules accepted: Orders

## 2024-07-03 ENCOUNTER — Ambulatory Visit
Admission: RE | Admit: 2024-07-03 | Discharge: 2024-07-03 | Disposition: A | Discharge status: Home / Self Care | Source: Ambulatory Visit | Attending: Adult Health | Admitting: Adult Health

## 2024-07-03 DIAGNOSIS — Z1231 Encounter for screening mammogram for malignant neoplasm of breast: Secondary | ICD-10-CM

## 2024-07-17 ENCOUNTER — Telehealth: Payer: Self-pay | Admitting: Diagnostic Neuroimaging

## 2024-07-17 NOTE — Telephone Encounter (Signed)
 Pt reports that Galcanezumab -gnlm (EMGALITY ) 120 MG/ML SOAJ  is not helping, she is having daily headaches, pt would like a call from RN to discuss trying Botox.

## 2024-07-18 NOTE — Telephone Encounter (Signed)
 Submitted benefit verification [id number].

## 2024-07-18 NOTE — Telephone Encounter (Signed)
 Called the pt and she would like to pursue botox. As of this moment she is due for next dose of emgality  and insurance has changed. Advised I will send a update to PA team with new insurance info for them to start PA for emgality . Ill also send the botox start up to our botox coordinator to get the authorization process going for botox. Pt aware that once she starts botox she will stop emgality .   New insurance info PH[employer] Member ID: [id number] [employer] [phone] RX Bin [id number] RX PCN: [id number] [id number]  Phone number for provider [phone]   Botox start up  J0585 G43.709

## 2024-07-19 ENCOUNTER — Telehealth: Payer: Self-pay

## 2024-07-19 ENCOUNTER — Other Ambulatory Visit (HOSPITAL_COMMUNITY): Payer: Self-pay

## 2024-07-19 NOTE — Telephone Encounter (Signed)
 Pharmacy Patient Advocate Encounter   Received notification from Physician's Office that prior authorization for Emgality  120MG /ML auto-injectors (migraine) is required/requested.   Insurance verification completed.   The patient is insured through [employer] .   Per test claim: PA required; PA submitted to above mentioned insurance via CoverMyMeds Key/confirmation #/EOC [id number] Status is pending

## 2024-07-20 ENCOUNTER — Telehealth: Payer: Self-pay | Admitting: Diagnostic Neuroimaging

## 2024-07-20 NOTE — Telephone Encounter (Addendum)
 [employer] requires you to call them and ask for PA form to be faxed. I called and requested this information, I should have it within 24 hours.  Botox start up  G9414 G43.709

## 2024-07-20 NOTE — Telephone Encounter (Signed)
 Completed PA form and faxed to [facility] @ [phone].

## 2024-07-25 ENCOUNTER — Other Ambulatory Visit (HOSPITAL_COMMUNITY): Payer: Self-pay

## 2024-07-25 NOTE — Telephone Encounter (Signed)
 Pharmacy Patient Advocate Encounter   Received notification from Physician's Office that prior authorization for Emgality  is required/requested.   Insurance verification completed.   The patient is insured through [employer] .   Per test claim: PA required; PA started via CoverMyMeds. KEY [id number] . Waiting for clinical questions to populate.  Submitted via Latent.

## 2024-07-25 NOTE — Telephone Encounter (Signed)
 Pharmacy Patient Advocate Encounter  Received notification from [employer] that Prior Authorization for Emgality  has been DENIED.  Full denial letter will be uploaded to the media tab. See denial reason below.   PA #/Case ID/Reference #: N/A

## 2024-07-28 ENCOUNTER — Ambulatory Visit: Admitting: Neurology

## 2024-08-01 NOTE — Telephone Encounter (Signed)
 Called [employer] to check status, shara is still pending with case # [id number]. Reference # for the call is [phone].

## 2024-08-02 MED ORDER — ONABOTULINUMTOXINA 200 UNITS IJ SOLR
INTRAMUSCULAR | 2 refills | Status: AC
Start: 2024-08-02 — End: ?

## 2024-08-02 NOTE — Addendum Note (Signed)
 Addended by: DELFINO AUGUSTIN BROCKS on: 08/02/2024 03:19 PM   Modules accepted: Orders

## 2024-08-02 NOTE — Telephone Encounter (Signed)
 Received fax that Destiny Fuentes was approved, please send rx to [facility].  Auth#: [id number] (08/01/24-08/01/25)  LVM asking pt to call me to schedule an appt.

## 2024-08-03 ENCOUNTER — Telehealth: Payer: Self-pay | Admitting: Diagnostic Neuroimaging

## 2024-08-03 NOTE — Telephone Encounter (Signed)
 Returned pt's call and scheduled with Harlene for 10/8 @ 9:45 am.

## 2024-08-03 NOTE — Telephone Encounter (Signed)
 Pt said she was called on yesterday about scheduling her initial Botox injection, please call to discuss further

## 2024-08-08 ENCOUNTER — Other Ambulatory Visit (HOSPITAL_COMMUNITY): Payer: Self-pay

## 2024-08-08 NOTE — Telephone Encounter (Addendum)
 Will outreach [employer] to verify, per test claim PA needed but per CMM/Latent no PA needed.

## 2024-08-08 NOTE — Telephone Encounter (Signed)
 The patient has a grandfathered PA approval from previous insurance that is good until June of 2026. No PA needed at this time per [employer] rep/ also states they do not have the PA number since it was a grandfathered claim from previous insurance carrier.    Per test claim REFILL TOO SOON, last filled on 07/21/2024, next fill due 08/14/2024.

## 2024-08-09 ENCOUNTER — Encounter: Payer: Self-pay | Admitting: Diagnostic Neuroimaging

## 2024-08-17 ENCOUNTER — Other Ambulatory Visit (HOSPITAL_COMMUNITY): Payer: Self-pay

## 2024-08-17 ENCOUNTER — Telehealth: Payer: Self-pay

## 2024-08-17 NOTE — Telephone Encounter (Signed)
 Pharmacy Patient Advocate Encounter   Received notification from Patient Advice Request messages that prior authorization for Nurtec is required/requested.   Insurance verification completed.   The patient is insured through [employer] .   Per test claim: PA required; PA submitted to above mentioned insurance via Latent Key/confirmation #/EOC [facility] Status is pending

## 2024-08-24 ENCOUNTER — Other Ambulatory Visit (HOSPITAL_COMMUNITY): Payer: Self-pay

## 2024-08-24 NOTE — Telephone Encounter (Signed)
 [facility] is requesting PA done through [employer]. I submitted this via CMM, status is pending. Key: [id number]

## 2024-09-04 ENCOUNTER — Ambulatory Visit: Admitting: Neurology

## 2024-09-18 ENCOUNTER — Other Ambulatory Visit (HOSPITAL_COMMUNITY): Payer: Self-pay

## 2024-09-18 NOTE — Telephone Encounter (Signed)
 Pharmacy Patient Advocate Encounter   Received notification from Physician's Office that prior authorization for Nurtec is required/requested.   Insurance verification completed.   The patient is insured through [employer].   Per test claim: PA required; PA submitted to above mentioned insurance via Latent Key/confirmation #/EOC [id number] Status is pending

## 2024-09-18 NOTE — Telephone Encounter (Signed)
 Botox PA must be ran through [facility]. Received original denial last week with ins requesting additional documentation. This morning I received another denial. Please review denial and let me know what you'd like me to do! It looks like they want to know exact # of hours the migraines lasted, as well as duration dates of each tried/failed med.  Denial is uploaded under media as Botox denial.

## 2024-09-20 ENCOUNTER — Ambulatory Visit: Admitting: Adult Health

## 2024-09-20 NOTE — Telephone Encounter (Signed)
 Faxed updated info to [employer].

## 2024-09-21 ENCOUNTER — Other Ambulatory Visit (HOSPITAL_COMMUNITY): Payer: Self-pay

## 2024-09-21 NOTE — Telephone Encounter (Signed)
 thanks

## 2024-09-21 NOTE — Telephone Encounter (Signed)
 I called [employer] and they state they do have an approved PA on file but the copay is very high-it is $989.00, The rep then advised that if PT has any questions about this copay amount to reach out to the plan itself to discuss. The rep checked the formulary, the copay, and the plan because upon test claim it still states PA needed for next months fill, transitional fill etc but does give a successful claim with a very high copay.

## 2024-09-21 NOTE — Telephone Encounter (Signed)
 Pharmacy Patient Advocate Encounter  Received notification from [employer] that Prior Authorization for Nurtec has been APPROVED from 09/20/2024 to 09/20/2025   PA #/Case ID/Reference #: [id number]

## 2024-09-25 NOTE — Telephone Encounter (Signed)
 Received a fax as well that shara was approved. I called [facility] and set up delivery for 09/28/24.  Auth#: [id number] (09/21/24-03/22/25)

## 2024-10-11 ENCOUNTER — Encounter: Payer: Self-pay | Admitting: Adult Health

## 2024-10-11 ENCOUNTER — Ambulatory Visit: Admitting: Adult Health

## 2024-10-11 VITALS — BP 137/81 | HR 67

## 2024-10-11 DIAGNOSIS — G43709 Chronic migraine without aura, not intractable, without status migrainosus: Secondary | ICD-10-CM

## 2024-10-11 MED ORDER — ONABOTULINUMTOXINA 200 UNITS IJ SOLR
155.0000 [IU] | Freq: Once | INTRAMUSCULAR | Status: AC
  Administered 2024-10-11: 155 [IU] via INTRAMUSCULAR

## 2024-10-11 MED ORDER — ZAVZPRET 10 MG/ACT NA SOLN: 10.0000 mg | NASAL | 11 refills | Status: DC | PRN

## 2024-10-11 NOTE — Progress Notes (Signed)
 Update 10/11/2024 JM: Patient is being seen for initial Botox injection.  She is currently experiencing about 15 migraines per month which can last up to 3 days.  Use of Nurtec with some benefit but only dulls the pain.  She has been having difficulty recently getting refills due to high co-pay.  She did have leftover Ubrelvy  that she tried but no significant benefit.  Sample for Zavzpret provided, will place order, she is aware of possibility of high co-pay as similar medication. She does plan on getting new insurance in the new year. Denies any benefit with Emgality . Believes migraines have improved more recently due to cooler weather, always worse during the summer. Will stop Emgality  as no benefit. Tolerated procedure well today. Return in 3 months for repeat injection.         Consent Form Botulism Toxin Injection For Chronic Migraine    Reviewed orally with patient, additionally signature is on file:  Botulism toxin has been approved by the Federal drug administration for treatment of chronic migraine. Botulism toxin does not cure chronic migraine and it may not be effective in some patients.  The administration of botulism toxin is accomplished by injecting a small amount of toxin into the muscles of the neck and head. Dosage must be titrated for each individual. Any benefits resulting from botulism toxin tend to wear off after 3 months with a repeat injection required if benefit is to be maintained. Injections are usually done every 3-4 months with maximum effect peak achieved by about 2 or 3 weeks. Botulism toxin is expensive and you should be sure of what costs you will incur resulting from the injection.  The side effects of botulism toxin use for chronic migraine may include:   -Transient, and usually mild, facial weakness with facial injections  -Transient, and usually mild, head or neck weakness with head/neck injections  -Reduction or loss of forehead facial animation  due to forehead muscle weakness  -Eyelid drooping  -Dry eye  -Pain at the site of injection or bruising at the site of injection  -Double vision  -Potential unknown long term risks   Contraindications: You should not have Botox if you are pregnant, nursing, allergic to albumin, have an infection, skin condition, or muscle weakness at the site of the injection, or have myasthenia gravis, Lambert-Eaton syndrome, or ALS.  It is also possible that as with any injection, there may be an allergic reaction or no effect from the medication. Reduced effectiveness after repeated injections is sometimes seen and rarely infection at the injection site may occur. All care will be taken to prevent these side effects. If therapy is given over a long time, atrophy and wasting in the muscle injected may occur. Occasionally the patient's become refractory to treatment because they develop antibodies to the toxin. In this event, therapy needs to be modified.  I have read the above information and consent to the administration of botulism toxin.    BOTOX PROCEDURE NOTE FOR MIGRAINE HEADACHE  Contraindications and precautions discussed with patient(above). Aseptic procedure was observed and patient tolerated procedure. Procedure performed by Harlene Bogaert, AGNP-BC.   The condition has existed for more than 6 months, and pt does not have a diagnosis of ALS, Myasthenia Gravis or Lambert-Eaton Syndrome.  Risks and benefits of injections discussed and pt agrees to proceed with the procedure.  Written consent obtained  These injections are medically necessary. Pt  receives good benefits from these injections. These injections do not cause  sedations or hallucinations which the oral therapies may cause.   Description of procedure:  The patient was placed in a sitting position. The standard protocol was used for Botox as follows, with 5 units of Botox injected at each site:  -Procerus muscle, midline  injection  -Corrugator muscle, bilateral injection  -Frontalis muscle, bilateral injection, with 2 sites each side, medial injection was performed in the upper one third of the frontalis muscle, in the region vertical from the medial inferior edge of the superior orbital rim. The lateral injection was again in the upper one third of the forehead vertically above the lateral limbus of the cornea, 1.5 cm lateral to the medial injection site.  -Temporalis muscle injection, 4 sites, bilaterally. The first injection was 3 cm above the tragus of the ear, second injection site was 1.5 cm to 3 cm up from the first injection site in line with the tragus of the ear. The third injection site was 1.5-3 cm forward between the first 2 injection sites. The fourth injection site was 1.5 cm posterior to the second injection site. 5th site laterally in the temporalis  muscleat the level of the outer canthus.  -Occipitalis muscle injection, 3 sites, bilaterally. The first injection was done one half way between the occipital protuberance and the tip of the mastoid process behind the ear. The second injection site was done lateral and superior to the first, 1 fingerbreadth from the first injection. The third injection site was 1 fingerbreadth superiorly and medially from the first injection site.  -Cervical paraspinal muscle injection, 2 sites, bilaterally. The first injection site was 1 cm from the midline of the cervical spine, 3 cm inferior to the lower border of the occipital protuberance. The second injection site was 1.5 cm superiorly and laterally to the first injection site.  -Trapezius muscle injection was performed at 3 sites, bilaterally. The first injection site was in the upper trapezius muscle halfway between the inflection point of the neck, and the acromion. The second injection site was one half way between the acromion and the first injection site. The third injection was done between the first injection  site and the inflection point of the neck.    A total of 200 units of Botox was prepared, 155 units of Botox was injected as documented above, any Botox not injected was wasted. The patient tolerated the procedure well, there were no complications of the above procedure.   Harlene Bogaert, AGNP-BC  [facility] [address] [place], [place] [address]  Phone [phone] Fax [phone] Note: This document was prepared with digital dictation and possible smart phrase technology. Any transcriptional errors that result from this process are unintentional.

## 2024-10-11 NOTE — Progress Notes (Signed)
 Botox- 200 units x 1 vial Lot: [id number] Expiration: 12/27 NDC: 9976-6078-97  Bacteriostatic 0.9% Sodium Chloride - 4 mL  Lot: [id number] Expiration: 10/13/25 NDC: 95908033  Dx: H56.290 S/P  Witnessed by Stephania, CMA

## 2024-10-16 ENCOUNTER — Encounter: Payer: Self-pay | Admitting: Adult Health

## 2024-10-16 ENCOUNTER — Other Ambulatory Visit: Payer: Self-pay

## 2024-10-16 ENCOUNTER — Ambulatory Visit (INDEPENDENT_AMBULATORY_CARE_PROVIDER_SITE_OTHER): Payer: Self-pay | Admitting: Adult Health

## 2024-10-16 VITALS — BP 138/84 | HR 62 | Temp 97.8°F | Resp 20 | Ht 69.45 in | Wt 224.0 lb

## 2024-10-16 DIAGNOSIS — M5441 Lumbago with sciatica, right side: Secondary | ICD-10-CM

## 2024-10-16 DIAGNOSIS — G43709 Chronic migraine without aura, not intractable, without status migrainosus: Secondary | ICD-10-CM

## 2024-10-16 DIAGNOSIS — I10 Essential (primary) hypertension: Secondary | ICD-10-CM | POA: Diagnosis not present

## 2024-10-16 DIAGNOSIS — M5442 Lumbago with sciatica, left side: Secondary | ICD-10-CM

## 2024-10-16 DIAGNOSIS — E782 Mixed hyperlipidemia: Secondary | ICD-10-CM

## 2024-10-16 DIAGNOSIS — J019 Acute sinusitis, unspecified: Secondary | ICD-10-CM

## 2024-10-16 DIAGNOSIS — E1142 Type 2 diabetes mellitus with diabetic polyneuropathy: Secondary | ICD-10-CM

## 2024-10-16 DIAGNOSIS — G8929 Other chronic pain: Secondary | ICD-10-CM

## 2024-10-16 DIAGNOSIS — B9689 Other specified bacterial agents as the cause of diseases classified elsewhere: Secondary | ICD-10-CM

## 2024-10-16 LAB — LIPID PANEL
Cholesterol: 198 mg/dL (ref <200)
HDL: 61 mg/dL (ref >=50)
LDL Cholesterol (Calc): 116 mg/dL — ABNORMAL HIGH
Non-HDL Cholesterol (Calc): 137 mg/dL — ABNORMAL HIGH (ref <130)
Total CHOL/HDL Ratio: 3.2 (calc) (ref <5.0)
Triglycerides: 104 mg/dL (ref <150)

## 2024-10-16 LAB — HEMOGLOBIN A1C
Hgb A1c MFr Bld: 6.5 % — ABNORMAL HIGH (ref <5.7)
Mean Plasma Glucose: 140 mg/dL
eAG (mmol/L): 7.7 mmol/L

## 2024-10-16 MED ORDER — AMOXICILLIN 875 MG PO TABS: 875.0000 mg | ORAL_TABLET | Freq: Two times a day (BID) | ORAL | 0 refills | Status: AC

## 2024-10-16 MED ORDER — ROSUVASTATIN CALCIUM 5 MG PO TABS
5.0000 mg | ORAL_TABLET | Freq: Every day | ORAL | 3 refills | Status: AC
Start: 2024-10-16 — End: ?

## 2024-10-16 NOTE — Progress Notes (Signed)
 [facility] clinic  Provider:  Jereld Serum DNP  Code Status:  Full Code  Goals of Care:     10/16/2024    8:43 AM  Advanced Directives  Does Patient Have a Medical Advance Directive? No  Would patient like information on creating a medical advance directive? No - Patient declined     Chief Complaint  Patient presents with   Medical Management of Chronic Issues    6 Month follow up.   Discussed the use of AI scribe software for clinical note transcription with the patient, who gave verbal consent to proceed.   HPI: Patient is a 58 y.o. female seen today for a 89-month follow up of chronic medical issues.  Discussed the use of AI scribe software for clinical note transcription with the patient, who gave verbal consent to proceed.  History of Present Illness   Destiny Fuentes is a 58 year old female with chronic migraines who presents for a six month follow-up.  She experiences chronic migraines and is currently undergoing treatment with Botox injections, which she started last week. She receives 31 injections every three months, and the treatment is covered by her insurance. She also uses Nurtec as needed but has not taken it recently. Previously, she was on Emgality  but discontinued it upon starting Botox. She has migraines every day or every other day, but recently had a couple of good weeks. She experienced migraines two days after her last Botox injection and again last night.  She has chronic bilateral low back pain with sciatica that worsens in cold weather. She takes gabapentin  300 mg three times a day for pain management.  She has primary hypertension and reports her blood pressure is generally good due to a low-salt diet. She takes lisinopril  10 mg daily but occasionally forgets to take it.  She has mixed hyperlipidemia and is not currently on medication. Her last LDL was 137, and her A1c was 6.8.  She reports a sinus infection with greenish nasal discharge and sinus  pressure, which started about a week ago. She experiences headaches but is unsure if they are related to her migraines. No fever.       Past Medical History:  Diagnosis Date   Bulging lumbar disc    H/O: hysterectomy 2010   Complete   Migraines     Past Surgical History:  Procedure Laterality Date   BACK SURGERY  2010   BUNIONECTOMY     COLONOSCOPY      Allergies  Allergen Reactions   Grass Pollen(K-O-R-T-Swt Vern) Other (See Comments)   Pollen Extract Other (See Comments)   Codeine Nausea And Vomiting    Outpatient Encounter Medications as of 10/16/2024  Medication Sig   Accu-Chek Softclix Lancets lancets 1 each by Other route daily. E11.42   amoxicillin  (AMOXIL ) 875 MG tablet Take 1 tablet (875 mg total) by mouth 2 (two) times daily for 10 days.   Blood Glucose Monitoring Suppl (ACCU-CHEK AVIVA PLUS) w/Device KIT 1 Device by Does not apply route daily. E11.42   botulinum toxin Type A (BOTOX) 200 units injection Inject 155 units into the face and neck muscles   COMBIGAN 0.2-0.5 % ophthalmic solution Apply 1 drop to eye 2 (two) times daily.   EPINEPHrine  (EPIPEN  2-PAK) 0.3 mg/0.3 mL IJ SOAJ injection Inject 0.3 mg into the muscle as needed for anaphylaxis.   gabapentin  (NEURONTIN ) 300 MG capsule Take 1 capsule (300 mg total) by mouth 3 (three) times daily.   glucose blood (FREESTYLE  TEST STRIPS) test strip Use one strip  to check blood sugar three a  day  dx E11.9   latanoprost (XALATAN) 0.005 % ophthalmic solution Place 1 drop into both eyes at bedtime.   lisinopril  (ZESTRIL ) 10 MG tablet Take 1 tablet (10 mg total) by mouth daily.   Multiple Vitamin (MULTIVITAMIN) tablet Take 1 tablet by mouth daily.   rosuvastatin (CRESTOR) 5 MG tablet Take 1 tablet (5 mg total) by mouth daily.   Zavegepant HCl (ZAVZPRET) 10 MG/ACT SOLN Place 10 mg into the nose as needed (migraine). Max 1 spray/24 hrs (Patient not taking: Reported on 10/16/2024)   No facility-administered encounter  medications on file as of 10/16/2024.    Review of Systems:  Review of Systems  Constitutional:  Negative for appetite change, chills, fatigue and fever.  HENT:  Negative for congestion, hearing loss, rhinorrhea and sore throat.   Eyes: Negative.   Respiratory:  Negative for cough, shortness of breath and wheezing.   Cardiovascular:  Negative for chest pain, palpitations and leg swelling.  Gastrointestinal:  Negative for abdominal pain, constipation, diarrhea, nausea and vomiting.  Genitourinary:  Negative for dysuria.  Musculoskeletal:  Positive for back pain. Negative for arthralgias and myalgias.  Skin:  Negative for color change, rash and wound.  Neurological:  Positive for headaches. Negative for dizziness and weakness.  Psychiatric/Behavioral:  Negative for behavioral problems. The patient is not nervous/anxious.     Health Maintenance  Topic Date Due   HEMOGLOBIN A1C  10/07/2024   Influenza Vaccine  04/24/2025 (Originally 07/14/2024)   DTaP/Tdap/Td (1 - Tdap) 10/16/2025 (Originally [birth year]/1983)   Pneumococcal Vaccine: 50+ Years (1 of 2 - PCV) 10/16/2025 (Originally [birth year]/25/1983)   Zoster Vaccines- Shingrix (1 of 2) 01/26/2028 (Originally 03/24/2014)   COVID-19 Vaccine (4 - 2025-26 season) 08/01/2028 (Originally 08/14/2024)   Diabetic kidney evaluation - eGFR measurement  04/07/2025   Diabetic kidney evaluation - Urine ACR  04/07/2025   OPHTHALMOLOGY EXAM  07/26/2025   FOOT EXAM  10/16/2025   Mammogram  07/03/2026   Colonoscopy  01/26/2028   Hepatitis B Vaccines 19-59 Average Risk  Aged Out   HPV VACCINES  Aged Out   Meningococcal B Vaccine  Aged Out   Hepatitis C Screening  Discontinued   HIV Screening  Discontinued    Physical Exam: Vitals:   10/16/24 0847  BP: 138/84  Pulse: 62  Resp: 20  Temp: 97.8 F (36.6 C)  SpO2: 98%  Weight: 224 lb (101.6 kg)  Height: 5' 9.45 (1.764 m)   Body mass index is 32.65 kg/m. Physical Exam Constitutional:      Appearance: She is  obese.  HENT:     Head: Normocephalic and atraumatic.     Nose: Nose normal.     Mouth/Throat:     Mouth: Mucous membranes are moist.  Eyes:     Conjunctiva/sclera: Conjunctivae normal.  Cardiovascular:     Rate and Rhythm: Normal rate and regular rhythm.  Pulmonary:     Effort: Pulmonary effort is normal.     Breath sounds: Normal breath sounds.  Abdominal:     General: Bowel sounds are normal.     Palpations: Abdomen is soft.  Musculoskeletal:        General: Normal range of motion.     Cervical back: Normal range of motion.  Skin:    General: Skin is warm and dry.  Neurological:     General: No focal deficit present.     Mental Status: She is  alert and oriented to person, place, and time.  Psychiatric:        Mood and Affect: Mood normal.        Behavior: Behavior normal.        Thought Content: Thought content normal.        Judgment: Judgment normal.     Labs reviewed: Basic Metabolic Panel: Recent Labs    04/07/24 0828  NA 139  K 4.1  CL 106  CO2 28  GLUCOSE 127*  BUN 12  CREATININE 0.83  CALCIUM 9.4   Liver Function Tests: Recent Labs    04/07/24 0828  AST 17  ALT 12  BILITOT 0.8  PROT 6.7   No results for input(s): LIPASE, AMYLASE in the last 8760 hours. No results for input(s): AMMONIA in the last 8760 hours. CBC: Recent Labs    04/07/24 0828  WBC 5.8  NEUTROABS 3,120  HGB 13.6  HCT 40.7  MCV 90.2  PLT 275   Lipid Panel: Recent Labs    04/07/24 0828  CHOL 231*  HDL 56  LDLCALC 150*  TRIG 126  CHOLHDL 4.1   Lab Results  Component Value Date   HGBA1C 6.8 (H) 04/07/2024    Procedures since last visit: No results found.  Assessment/Plan  1. Acute bacterial rhinosinusitis (Primary) -  with greenish nasal discharge and headache, likely allergy-related. - Prescribe amoxicillin  875 mg twice a day for 10 days. - amoxicillin  (AMOXIL ) 875 MG tablet; Take 1 tablet (875 mg total) by mouth 2 (two) times daily for 10 days.   Dispense: 20 tablet; Refill: 0  2. Primary hypertension -  with blood pressure at 138/84 mmHg, well-controlled with lisinopril  and dietary modifications. - Continue lisinopril  10 mg daily. - Maintain low salt diet.   3. Chronic migraine w/o aura w/o status migrainosus, not intractable -  migraines improved with Botox, now every other day. Nurtec used as needed, currently unavailable. Emgality  discontinued.  - Continue Botox injections every three months. - Use Nurtec as needed for acute migraine relief. - Consider gabapentin  for additional migraine relief.  4. 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 - Continue gabapentin  300 mg three times a day.  5. Type 2 diabetes mellitus with diabetic polyneuropathy, without long-term current use of insulin (HCC) -  with A1c of 6.8%.  -  not taking any medications - Order repeat A1c.  6. Mixed hyperlipidemia -  with LDL at 137 mg/dL. Goal LDL is 70 mg/dL or below. Discussed starting Crestor if levels remain elevated. - Order repeat lipid panel. - Start Crestor 5 mg if cholesterol levels remain elevated. - rosuvastatin (CRESTOR) 5 MG tablet; Take 1 tablet (5 mg total) by mouth daily.  Dispense: 90 tablet; Refill: 3    Labs/tests ordered:   for lipid panel and A1C   Return in about 6 months (around 04/15/2025).  Charon Smedberg Medina-Vargas, NP

## 2024-10-16 NOTE — Patient Instructions (Signed)

## 2024-10-20 ENCOUNTER — Ambulatory Visit: Payer: Self-pay | Admitting: Adult Health

## 2024-10-20 NOTE — Progress Notes (Signed)
-    A1C 6.5, down from 6.8, good! -  LDL 116, down from 150, continue Crestor, goal LDL <70 -  cholesterol 198, down from 231 -  continue current medications

## 2024-11-20 ENCOUNTER — Ambulatory Visit: Admitting: Adult Health

## 2024-12-26 ENCOUNTER — Telehealth: Payer: Self-pay | Admitting: Adult Health

## 2024-12-26 NOTE — Telephone Encounter (Signed)
 Marcie called  from [facility] speciality  to schedule Pt Botox   Botox  delivery  Dates 12/28/2024 200 units quantity of 1    No seg required

## 2024-12-26 NOTE — Telephone Encounter (Signed)
Updated appt note

## 2025-01-04 ENCOUNTER — Ambulatory Visit: Admitting: Adult Health

## 2025-01-04 ENCOUNTER — Encounter: Payer: Self-pay | Admitting: Adult Health

## 2025-01-04 ENCOUNTER — Other Ambulatory Visit: Payer: Self-pay | Admitting: Adult Health

## 2025-01-04 VITALS — BP 127/82 | HR 67

## 2025-01-04 DIAGNOSIS — G43709 Chronic migraine without aura, not intractable, without status migrainosus: Secondary | ICD-10-CM

## 2025-01-04 MED ORDER — ONABOTULINUMTOXINA 200 UNITS IJ SOLR
155.0000 [IU] | Freq: Once | INTRAMUSCULAR | Status: AC
  Administered 2025-01-04: 155 [IU] via INTRAMUSCULAR

## 2025-01-04 MED ORDER — KETOROLAC TROMETHAMINE 30 MG/ML IJ SOLN: 30.0000 mg | Freq: Once | INTRAMUSCULAR | 0 refills | Status: DC

## 2025-01-04 MED ORDER — NARATRIPTAN HCL 2.5 MG PO TABS: 2.5000 mg | ORAL_TABLET | ORAL | 11 refills | Status: AC | PRN

## 2025-01-04 MED ORDER — "SYRINGE 25G X 1"" 3 ML MISC": 0 refills | Status: DC

## 2025-01-04 MED ORDER — KETOROLAC TROMETHAMINE 60 MG/2ML IM SOLN: 30.0000 mg | Freq: Once | INTRAMUSCULAR | 0 refills | Status: AC

## 2025-01-04 MED ORDER — METHYLPREDNISOLONE 4 MG PO TBPK: ORAL_TABLET | ORAL | 0 refills | Status: AC

## 2025-01-04 MED ORDER — "SYRINGE 25G X 1"" 3 ML MISC": 0 refills | Status: AC

## 2025-01-04 NOTE — Telephone Encounter (Signed)
 Can you contact [facility] to see if they have this?

## 2025-01-04 NOTE — Telephone Encounter (Signed)
 Patient notified

## 2025-01-04 NOTE — Telephone Encounter (Signed)
 Called [facility] and spoke to the pharmacist, and was informed that they do not dispense the injectable, all they carry is the pill. Pharmacy staff stated that maybe an independent pharmacy like [facility] might be able to order it.  How would you like to proceed?

## 2025-01-04 NOTE — Telephone Encounter (Signed)
 Please advise patient an updated order placed for Toradol  IM injection to [facility].  Prescribed 60 mg vial but ensure she only draws up 1ml (30mg  dosing). She should be able to obtain the needle and syringe through a pharmacy as well.

## 2025-01-04 NOTE — Addendum Note (Signed)
 Addended by: WHITFIELD RAISIN L on: 01/04/2025 04:45 PM   Modules accepted: Orders

## 2025-01-04 NOTE — Progress Notes (Signed)
 "    Update 01/04/2025 JM: Patient is being seen for repeat Botox  injection.  Prior initial injection 10/11/2024.  Continues to have about 15 migraines per month but has improved as far as migraine duration and severity after initial Botox  injection. Migraines resolve after only a couple of hours.  Hopeful migraines will start to reduce in frequency as well after consecutive Botox  injections as at times it can take several injections prior to taking effect.  She does not currently have rescue medication due to high copay with Nurtec and Ubrelvy . She questions other rescue options. She has previously tried rizatriptan  and sumatriptan. Will try naratriptan  2.5mg  PRN, can repeat after 4 hours if needed. She reports persist 6 day migraine, has been taking Excedrin but no benefit, has not used in the past 2 days. Denies any other NSAID use.  Insurance would not cover in office Toradol  injection, provided Toradol  30mg  injection once through pharmacy as well as syringe, she is aware on how to administer IM injections.  If migraine persist, she will start steroid taper pack. Tolerated procedure well today. Return in 3 months for repeat injection.         Consent Form Botulism Toxin Injection For Chronic Migraine    Reviewed orally with patient, additionally signature is on file:  Botulism toxin has been approved by the Federal drug administration for treatment of chronic migraine. Botulism toxin does not cure chronic migraine and it may not be effective in some patients.  The administration of botulism toxin is accomplished by injecting a small amount of toxin into the muscles of the neck and head. Dosage must be titrated for each individual. Any benefits resulting from botulism toxin tend to wear off after 3 months with a repeat injection required if benefit is to be maintained. Injections are usually done every 3-4 months with maximum effect peak achieved by about 2 or 3 weeks. Botulism toxin is  expensive and you should be sure of what costs you will incur resulting from the injection.  The side effects of botulism toxin use for chronic migraine may include:   -Transient, and usually mild, facial weakness with facial injections  -Transient, and usually mild, head or neck weakness with head/neck injections  -Reduction or loss of forehead facial animation due to forehead muscle weakness  -Eyelid drooping  -Dry eye  -Pain at the site of injection or bruising at the site of injection  -Double vision  -Potential unknown long term risks   Contraindications: You should not have Botox  if you are pregnant, nursing, allergic to albumin, have an infection, skin condition, or muscle weakness at the site of the injection, or have myasthenia gravis, Lambert-Eaton syndrome, or ALS.  It is also possible that as with any injection, there may be an allergic reaction or no effect from the medication. Reduced effectiveness after repeated injections is sometimes seen and rarely infection at the injection site may occur. All care will be taken to prevent these side effects. If therapy is given over a long time, atrophy and wasting in the muscle injected may occur. Occasionally the patient's become refractory to treatment because they develop antibodies to the toxin. In this event, therapy needs to be modified.  I have read the above information and consent to the administration of botulism toxin.    BOTOX  PROCEDURE NOTE FOR MIGRAINE HEADACHE  Contraindications and precautions discussed with patient(above). Aseptic procedure was observed and patient tolerated procedure. Procedure performed by Harlene Bogaert, AGNP-BC.   The condition has  existed for more than 6 months, and pt does not have a diagnosis of ALS, Myasthenia Gravis or Lambert-Eaton Syndrome.  Risks and benefits of injections discussed and pt agrees to proceed with the procedure.  Written consent obtained  These injections are medically  necessary. Pt  receives good benefits from these injections. These injections do not cause sedations or hallucinations which the oral therapies may cause.   Description of procedure:  The patient was placed in a sitting position. The standard protocol was used for Botox  as follows, with 5 units of Botox  injected at each site:  -Procerus muscle, midline injection  -Corrugator muscle, bilateral injection  -Frontalis muscle, bilateral injection, with 2 sites each side, medial injection was performed in the upper one third of the frontalis muscle, in the region vertical from the medial inferior edge of the superior orbital rim. The lateral injection was again in the upper one third of the forehead vertically above the lateral limbus of the cornea, 1.5 cm lateral to the medial injection site.  -Temporalis muscle injection, 4 sites, bilaterally. The first injection was 3 cm above the tragus of the ear, second injection site was 1.5 cm to 3 cm up from the first injection site in line with the tragus of the ear. The third injection site was 1.5-3 cm forward between the first 2 injection sites. The fourth injection site was 1.5 cm posterior to the second injection site. 5th site laterally in the temporalis  muscleat the level of the outer canthus.  -Occipitalis muscle injection, 3 sites, bilaterally. The first injection was done one half way between the occipital protuberance and the tip of the mastoid process behind the ear. The second injection site was done lateral and superior to the first, 1 fingerbreadth from the first injection. The third injection site was 1 fingerbreadth superiorly and medially from the first injection site.  -Cervical paraspinal muscle injection, 2 sites, bilaterally. The first injection site was 1 cm from the midline of the cervical spine, 3 cm inferior to the lower border of the occipital protuberance. The second injection site was 1.5 cm superiorly and laterally to the first  injection site.  -Trapezius muscle injection was performed at 3 sites, bilaterally. The first injection site was in the upper trapezius muscle halfway between the inflection point of the neck, and the acromion. The second injection site was one half way between the acromion and the first injection site. The third injection was done between the first injection site and the inflection point of the neck.    A total of 200 units of Botox  was prepared, 155 units of Botox  was injected as documented above, any Botox  not injected was wasted. The patient tolerated the procedure well, there were no complications of the above procedure.   Harlene Bogaert, AGNP-BC  [facility] [address] [place], [place] [address]  Phone [phone] Fax [phone] Note: This document was prepared with digital dictation and possible smart phrase technology. Any transcriptional errors that result from this process are unintentional.    "

## 2025-01-04 NOTE — Progress Notes (Signed)
 Botox - 200 units x 1 vial Lot: [id number] Expiration: 2028/03 NDC: 0023-3921-02  Bacteriostatic 0.9% Sodium Chloride - 4mL total Onu:[id number] Expiration: 2026-OCT-31 NDC: 9590-8033-97  Dx: G43.709 S/P  Witnessed by: Sherrod BIRCH, RMA

## 2025-01-04 NOTE — Telephone Encounter (Signed)
 [facility] does have Ketorolac  in stock, they have the 60mg /3ml single dose vials.

## 2025-04-03 ENCOUNTER — Ambulatory Visit: Admitting: Adult Health

## 2025-04-16 ENCOUNTER — Ambulatory Visit: Admitting: Adult Health
